# Patient Record
Sex: Male | Born: 1960
Health system: Southern US, Community
[De-identification: ages and names within clinical notes are randomized; demographics above are authoritative.]

## PROBLEM LIST (undated history)

## (undated) DIAGNOSIS — I10 Essential (primary) hypertension: Secondary | ICD-10-CM

## (undated) DIAGNOSIS — J45909 Unspecified asthma, uncomplicated: Secondary | ICD-10-CM

## (undated) DIAGNOSIS — G4730 Sleep apnea, unspecified: Secondary | ICD-10-CM

## (undated) DIAGNOSIS — M1990 Unspecified osteoarthritis, unspecified site: Secondary | ICD-10-CM

## (undated) DIAGNOSIS — R0600 Dyspnea, unspecified: Secondary | ICD-10-CM

## (undated) HISTORY — PX: BRAIN SURGERY: SHX531

## (undated) HISTORY — PX: BACK SURGERY: SHX140

## (undated) HISTORY — PX: FOOT SURGERY: SHX648

## (undated) HISTORY — PX: SHOULDER SURGERY: SHX246

## (undated) HISTORY — PX: KNEE SURGERY: SHX244

---

## 2002-10-10 ENCOUNTER — Emergency Department (HOSPITAL_COMMUNITY): Admission: EM | Admit: 2002-10-10 | Discharge: 2002-10-10 | Payer: Self-pay | Admitting: Emergency Medicine

## 2003-08-03 ENCOUNTER — Emergency Department (HOSPITAL_COMMUNITY): Admission: EM | Admit: 2003-08-03 | Discharge: 2003-08-03 | Payer: Self-pay | Admitting: Emergency Medicine

## 2003-08-04 ENCOUNTER — Emergency Department (HOSPITAL_COMMUNITY): Admission: EM | Admit: 2003-08-04 | Discharge: 2003-08-04 | Payer: Self-pay | Admitting: Emergency Medicine

## 2003-08-30 ENCOUNTER — Ambulatory Visit (HOSPITAL_COMMUNITY): Admission: RE | Admit: 2003-08-30 | Discharge: 2003-08-30 | Payer: Self-pay | Admitting: Family Medicine

## 2003-11-20 ENCOUNTER — Emergency Department (HOSPITAL_COMMUNITY): Admission: EM | Admit: 2003-11-20 | Discharge: 2003-11-20 | Payer: Self-pay | Admitting: Emergency Medicine

## 2003-11-28 ENCOUNTER — Ambulatory Visit (HOSPITAL_COMMUNITY): Admission: RE | Admit: 2003-11-28 | Discharge: 2003-11-28 | Payer: Self-pay | Admitting: General Surgery

## 2004-02-04 ENCOUNTER — Emergency Department (HOSPITAL_COMMUNITY): Admission: EM | Admit: 2004-02-04 | Discharge: 2004-02-05 | Payer: Self-pay | Admitting: *Deleted

## 2004-02-06 ENCOUNTER — Inpatient Hospital Stay (HOSPITAL_COMMUNITY): Admission: AD | Admit: 2004-02-06 | Discharge: 2004-02-09 | Payer: Self-pay | Admitting: General Surgery

## 2005-03-22 ENCOUNTER — Emergency Department (HOSPITAL_COMMUNITY): Admission: EM | Admit: 2005-03-22 | Discharge: 2005-03-22 | Payer: Self-pay | Admitting: Emergency Medicine

## 2005-05-31 ENCOUNTER — Emergency Department (HOSPITAL_COMMUNITY): Admission: EM | Admit: 2005-05-31 | Discharge: 2005-05-31 | Payer: Self-pay | Admitting: Emergency Medicine

## 2005-09-24 ENCOUNTER — Emergency Department (HOSPITAL_COMMUNITY): Admission: EM | Admit: 2005-09-24 | Discharge: 2005-09-24 | Payer: Self-pay | Admitting: *Deleted

## 2005-10-04 ENCOUNTER — Emergency Department (HOSPITAL_COMMUNITY): Admission: EM | Admit: 2005-10-04 | Discharge: 2005-10-04 | Payer: Self-pay | Admitting: Emergency Medicine

## 2005-12-06 ENCOUNTER — Emergency Department (HOSPITAL_COMMUNITY): Admission: EM | Admit: 2005-12-06 | Discharge: 2005-12-06 | Payer: Self-pay | Admitting: Emergency Medicine

## 2005-12-14 ENCOUNTER — Emergency Department (HOSPITAL_COMMUNITY): Admission: EM | Admit: 2005-12-14 | Discharge: 2005-12-14 | Payer: Self-pay | Admitting: Emergency Medicine

## 2005-12-28 ENCOUNTER — Emergency Department (HOSPITAL_COMMUNITY): Admission: EM | Admit: 2005-12-28 | Discharge: 2005-12-28 | Payer: Self-pay | Admitting: Emergency Medicine

## 2006-02-11 ENCOUNTER — Inpatient Hospital Stay (HOSPITAL_COMMUNITY): Admission: EM | Admit: 2006-02-11 | Discharge: 2006-02-16 | Payer: Self-pay | Admitting: Emergency Medicine

## 2006-02-13 ENCOUNTER — Ambulatory Visit: Payer: Self-pay | Admitting: *Deleted

## 2006-02-20 ENCOUNTER — Inpatient Hospital Stay (HOSPITAL_COMMUNITY): Admission: EM | Admit: 2006-02-20 | Discharge: 2006-02-24 | Payer: Self-pay | Admitting: Emergency Medicine

## 2006-02-27 ENCOUNTER — Inpatient Hospital Stay (HOSPITAL_COMMUNITY): Admission: EM | Admit: 2006-02-27 | Discharge: 2006-03-05 | Payer: Self-pay | Admitting: Emergency Medicine

## 2006-03-04 ENCOUNTER — Encounter (INDEPENDENT_AMBULATORY_CARE_PROVIDER_SITE_OTHER): Payer: Self-pay | Admitting: *Deleted

## 2007-08-14 ENCOUNTER — Emergency Department (HOSPITAL_COMMUNITY): Admission: EM | Admit: 2007-08-14 | Discharge: 2007-08-14 | Payer: Self-pay | Admitting: Emergency Medicine

## 2007-09-10 ENCOUNTER — Emergency Department (HOSPITAL_COMMUNITY): Admission: EM | Admit: 2007-09-10 | Discharge: 2007-09-10 | Payer: Self-pay | Admitting: Emergency Medicine

## 2007-12-28 ENCOUNTER — Ambulatory Visit: Admission: RE | Admit: 2007-12-28 | Discharge: 2007-12-28 | Payer: Self-pay | Admitting: Family Medicine

## 2008-01-08 ENCOUNTER — Emergency Department (HOSPITAL_COMMUNITY): Admission: EM | Admit: 2008-01-08 | Discharge: 2008-01-08 | Payer: Self-pay | Admitting: Emergency Medicine

## 2008-03-13 ENCOUNTER — Emergency Department (HOSPITAL_COMMUNITY): Admission: EM | Admit: 2008-03-13 | Discharge: 2008-03-13 | Payer: Self-pay | Admitting: Emergency Medicine

## 2008-03-14 ENCOUNTER — Emergency Department (HOSPITAL_COMMUNITY): Admission: EM | Admit: 2008-03-14 | Discharge: 2008-03-14 | Payer: Self-pay | Admitting: Emergency Medicine

## 2009-11-03 ENCOUNTER — Emergency Department (HOSPITAL_COMMUNITY): Admission: EM | Admit: 2009-11-03 | Discharge: 2009-11-03 | Payer: Self-pay | Admitting: Emergency Medicine

## 2009-11-23 ENCOUNTER — Ambulatory Visit (HOSPITAL_COMMUNITY): Admission: RE | Admit: 2009-11-23 | Discharge: 2009-11-23 | Payer: Self-pay | Admitting: Family Medicine

## 2009-12-26 ENCOUNTER — Emergency Department (HOSPITAL_COMMUNITY): Admission: EM | Admit: 2009-12-26 | Discharge: 2009-12-26 | Payer: Self-pay | Admitting: Emergency Medicine

## 2010-01-01 ENCOUNTER — Ambulatory Visit (HOSPITAL_COMMUNITY): Admission: RE | Admit: 2010-01-01 | Discharge: 2010-01-01 | Payer: Self-pay | Admitting: Family Medicine

## 2010-01-31 ENCOUNTER — Emergency Department (HOSPITAL_COMMUNITY): Admission: EM | Admit: 2010-01-31 | Discharge: 2010-01-31 | Payer: Self-pay | Admitting: Emergency Medicine

## 2010-02-17 ENCOUNTER — Emergency Department (HOSPITAL_COMMUNITY): Admission: EM | Admit: 2010-02-17 | Discharge: 2010-02-17 | Payer: Self-pay | Admitting: Emergency Medicine

## 2010-02-26 ENCOUNTER — Emergency Department (HOSPITAL_COMMUNITY): Admission: EM | Admit: 2010-02-26 | Discharge: 2010-02-26 | Payer: Self-pay | Admitting: Emergency Medicine

## 2010-04-16 ENCOUNTER — Emergency Department (HOSPITAL_COMMUNITY): Admission: EM | Admit: 2010-04-16 | Discharge: 2010-04-16 | Payer: Self-pay | Admitting: Emergency Medicine

## 2010-06-11 ENCOUNTER — Emergency Department (HOSPITAL_COMMUNITY): Admission: EM | Admit: 2010-06-11 | Discharge: 2010-06-11 | Payer: Self-pay | Admitting: Emergency Medicine

## 2010-06-17 ENCOUNTER — Emergency Department (HOSPITAL_COMMUNITY): Admission: EM | Admit: 2010-06-17 | Discharge: 2010-06-17 | Payer: Self-pay | Admitting: Emergency Medicine

## 2010-08-02 ENCOUNTER — Emergency Department (HOSPITAL_COMMUNITY): Admission: EM | Admit: 2010-08-02 | Discharge: 2010-08-02 | Payer: Self-pay | Admitting: Emergency Medicine

## 2010-09-05 ENCOUNTER — Emergency Department (HOSPITAL_COMMUNITY)
Admission: EM | Admit: 2010-09-05 | Discharge: 2010-09-05 | Payer: Self-pay | Source: Home / Self Care | Admitting: Emergency Medicine

## 2010-09-16 ENCOUNTER — Emergency Department (HOSPITAL_COMMUNITY)
Admission: EM | Admit: 2010-09-16 | Discharge: 2010-09-16 | Payer: Self-pay | Source: Home / Self Care | Admitting: Emergency Medicine

## 2010-09-28 ENCOUNTER — Emergency Department (HOSPITAL_COMMUNITY)
Admission: EM | Admit: 2010-09-28 | Discharge: 2010-09-28 | Payer: Self-pay | Source: Home / Self Care | Admitting: Emergency Medicine

## 2010-10-02 ENCOUNTER — Emergency Department (HOSPITAL_COMMUNITY)
Admission: EM | Admit: 2010-10-02 | Discharge: 2010-10-02 | Payer: Self-pay | Source: Home / Self Care | Admitting: Emergency Medicine

## 2010-10-10 ENCOUNTER — Emergency Department (HOSPITAL_COMMUNITY)
Admission: EM | Admit: 2010-10-10 | Discharge: 2010-10-10 | Payer: Self-pay | Source: Home / Self Care | Admitting: Emergency Medicine

## 2010-10-25 ENCOUNTER — Ambulatory Visit (HOSPITAL_COMMUNITY)
Admission: RE | Admit: 2010-10-25 | Discharge: 2010-10-25 | Payer: Self-pay | Source: Home / Self Care | Attending: Family Medicine | Admitting: Family Medicine

## 2010-11-03 ENCOUNTER — Encounter: Payer: Self-pay | Admitting: Family Medicine

## 2010-11-15 ENCOUNTER — Other Ambulatory Visit (HOSPITAL_COMMUNITY): Payer: Self-pay | Admitting: Family Medicine

## 2010-11-15 DIAGNOSIS — R52 Pain, unspecified: Secondary | ICD-10-CM

## 2010-11-16 ENCOUNTER — Emergency Department (HOSPITAL_COMMUNITY)
Admission: EM | Admit: 2010-11-16 | Discharge: 2010-11-16 | Disposition: A | Discharge status: Home / Self Care | Payer: PRIVATE HEALTH INSURANCE | Attending: Emergency Medicine | Admitting: Emergency Medicine

## 2010-11-16 ENCOUNTER — Emergency Department (HOSPITAL_COMMUNITY)
Admit: 2010-11-16 | Discharge: 2010-11-16 | Disposition: A | Discharge status: Home / Self Care | Payer: PRIVATE HEALTH INSURANCE

## 2010-11-16 DIAGNOSIS — Y9229 Other specified public building as the place of occurrence of the external cause: Secondary | ICD-10-CM | POA: Insufficient documentation

## 2010-11-16 DIAGNOSIS — M79609 Pain in unspecified limb: Secondary | ICD-10-CM | POA: Insufficient documentation

## 2010-11-16 DIAGNOSIS — S61209A Unspecified open wound of unspecified finger without damage to nail, initial encounter: Secondary | ICD-10-CM | POA: Insufficient documentation

## 2010-11-16 DIAGNOSIS — I10 Essential (primary) hypertension: Secondary | ICD-10-CM | POA: Insufficient documentation

## 2010-11-16 DIAGNOSIS — Z9889 Other specified postprocedural states: Secondary | ICD-10-CM | POA: Insufficient documentation

## 2010-11-16 DIAGNOSIS — G40909 Epilepsy, unspecified, not intractable, without status epilepticus: Secondary | ICD-10-CM | POA: Insufficient documentation

## 2010-11-16 DIAGNOSIS — W268XXA Contact with other sharp object(s), not elsewhere classified, initial encounter: Secondary | ICD-10-CM | POA: Insufficient documentation

## 2010-11-20 ENCOUNTER — Ambulatory Visit (HOSPITAL_COMMUNITY): Payer: PRIVATE HEALTH INSURANCE

## 2010-11-21 ENCOUNTER — Ambulatory Visit (HOSPITAL_COMMUNITY)
Admission: RE | Admit: 2010-11-21 | Discharge: 2010-11-21 | Disposition: A | Discharge status: Home / Self Care | Payer: PRIVATE HEALTH INSURANCE | Source: Ambulatory Visit | Attending: Family Medicine | Admitting: Family Medicine

## 2010-11-21 DIAGNOSIS — M5126 Other intervertebral disc displacement, lumbar region: Secondary | ICD-10-CM | POA: Insufficient documentation

## 2010-11-21 DIAGNOSIS — M51379 Other intervertebral disc degeneration, lumbosacral region without mention of lumbar back pain or lower extremity pain: Secondary | ICD-10-CM | POA: Insufficient documentation

## 2010-11-21 DIAGNOSIS — M545 Low back pain, unspecified: Secondary | ICD-10-CM | POA: Insufficient documentation

## 2010-11-21 DIAGNOSIS — R52 Pain, unspecified: Secondary | ICD-10-CM

## 2010-11-21 DIAGNOSIS — M6281 Muscle weakness (generalized): Secondary | ICD-10-CM | POA: Insufficient documentation

## 2010-11-21 DIAGNOSIS — M5137 Other intervertebral disc degeneration, lumbosacral region: Secondary | ICD-10-CM | POA: Insufficient documentation

## 2010-11-21 DIAGNOSIS — M79609 Pain in unspecified limb: Secondary | ICD-10-CM | POA: Insufficient documentation

## 2010-12-30 LAB — DIFFERENTIAL
Basophils Absolute: 0 10*3/uL (ref 0.0–0.1)
Basophils Relative: 0 % (ref 0–1)
Eosinophils Absolute: 0.2 10*3/uL (ref 0.0–0.7)
Eosinophils Relative: 3 % (ref 0–5)
Lymphocytes Relative: 28 % (ref 12–46)

## 2010-12-30 LAB — POCT CARDIAC MARKERS
CKMB, poc: <1 ng/mL — ABNORMAL LOW (ref 1.0–8.0)
Troponin i, poc: <0.05 ng/mL (ref 0.00–0.09)

## 2010-12-30 LAB — BASIC METABOLIC PANEL
CO2: 28 mEq/L (ref 19–32)
Calcium: 9.5 mg/dL (ref 8.4–10.5)
GFR calc non Af Amer: >60 mL/min (ref >60)
Glucose, Bld: 94 mg/dL (ref 70–99)
Potassium: 3.8 mEq/L (ref 3.5–5.1)

## 2010-12-30 LAB — CBC
Hemoglobin: 14.9 g/dL (ref 13.0–17.0)
MCV: 84.9 fL (ref 78.0–100.0)
Platelets: 295 10*3/uL (ref 150–400)
RDW: 14.8 % (ref 11.5–15.5)

## 2011-02-26 NOTE — Procedures (Signed)
NAME:  [name], [name]               ACCOUNT NO.:  [id number]   MEDICAL RECORD NO.:  [id number]          PATIENT TYPE:  OUT   LOCATION:  SLEE                          FACILITY:  [facility]   PHYSICIAN:  Kofi A. Gerilyn Pilgrim, M.D. DATE OF BIRTH:  Nov 14, 1960   DATE OF PROCEDURE:  DATE OF DISCHARGE:  12/28/2007                             SLEEP DISORDER REPORT   POLYSOMNOGRAPH REPORT.   DATE OF STUDY:  December 28, 2007.   REFERRING PHYSICIAN:  Kofi A. Gerilyn Pilgrim, M.D.   INDICATIONS FOR PROCEDURE:  The patient is a 50 year old man who has  snoring, hypersomnia and is suspected of having obstructive sleep  pattern syndrome.  BMI 39.   MEDICATIONS:  Dilantin, Zyrtec.   SLEEPING SCALE:  7.   SLEEP STAGE SUMMARY:  Total recording time of 419 minutes.  Sleep  efficiency 82 minutes.  Sleep patency 15 minute.  REM latency 100%, 90  minutes.  Stage N1 7.6% and 276.6%, N3 3.5% and REM sleep 12.4%.   RESPIRATORY SUMMARY:  AHI 48.8.  There were 278 high hypopneic events  and 2 central events.  There were 2 mixed events.  The baseline oxygen  saturation is 96% with the lowest saturation 83% REM sleep.  The REM AHI  is 36.7.  The non REM AHI is 50.   LIMB MOVEMENTS SUMMARY:  The PLM index is 0.   ELECTROCARDIOGRAM SUMMARY:  Average heart rate 61.   ASSESSMENT:  Severe obstructive sleep pattern syndrome.      Kofi A. Gerilyn Pilgrim, M.D.  Electronically Signed     KAD/MEDQ  D:  12/29/2007  T:  12/30/2007  Job:  [id number]

## 2011-03-01 NOTE — H&P (Signed)
NAMERYKAR, WITTENBORN               ACCOUNT NO.:  [id number]   MEDICAL RECORD NO.:  [id number]          PATIENT TYPE:  INP   LOCATION:  IC03                          FACILITY:  [facility]   PHYSICIAN:  Margaretmary Dys, M.D.DATE OF BIRTH:  September 21, 1961   DATE OF ADMISSION:  02/20/2006  DATE OF DISCHARGE:  LH                                HISTORY & PHYSICAL   ADMISSION DIAGNOSES:  1.  Hypoxic respiratory failure.  2.  Bilateral multifocal pneumonia, concerning for atypical pneumonia versus      PCP.  3.  Hypertension.  4.  History of polysubstance abuse.   CHIEF COMPLAINT:  Shortness of breath.   HISTORY OF PRESENT ILLNESS:  [name] is a 50 year old African-American  male who presented to the emergency room with worsening shortness of breath  and cough over 3-days duration.  The patient was actually in the hospital on  Feb 11, 2006, and was discharged on Feb 16, 2006 with symptoms of pneumonia.  He was sent home on Vantin.  The patient said he felt slightly better, but  went back to work and ever since he has been back to work he has not felt  the same.  He said he has been feeling hot and cold, but he has not checked  his temperature.  He denies any nausea or vomiting or abdominal pain.  He  also reports pleuritic chest pain, mostly on the flank especially when  coughing.  He denies any hemoptysis.  He feels like he has no weight loss.   REVIEW OF SYSTEMS:  The 10-point review of systems otherwise negative,  except as mentioned in the history of present illness.   PAST MEDICAL HISTORY:  1.  Bilateral pneumonia, recent admission Feb 11, 2006.  2.  Hypertension.  3.  Polysubstance abuse.  4.  Questionable history of seizure disorder.  5.  History of closed-head trauma.  6.  History of hemorrhoids.   DISCHARGE MEDICATIONS:  (Discharge medications on Feb 16, 2006 were)  1.  Vantin 200 mg p.o. b.i.d. for 5 days.  2.  Combivent q.4 h. 2 puffs.  3.  Aspirin 81 mg p.o. once a day.  4.   Benicar/hydrochlorothiazide 20/25 mg one p.o. daily.  5.  Prednisone 10 mg p.o. daily for 2 days, then stop.  6.  Vicodin 5/500 mg as needed q.4 h. for pain.   ALLERGIES:  He reports NO KNOWN DRUG ALLERGIES.   FAMILY HISTORY:  Positive for hypertension; no coronary artery disease.   SOCIAL HISTORY:  The patient is single.  He smokes up to about a pack of  cigarettes a day.  He has a 30-pack/yr of smoking.  He quit drinking alcohol  several months ago.  He admits to using cocaine occasionally.  He is not  married and has a child.  He works part-time as a Music therapist.   PHYSICAL EXAMINATION:  GENERAL:  The patient is mild to moderate respiratory  distress, especially on completing sentences.  VITAL SIGNS:  Oxygen saturation was noted to be 80% on 4 liters on arrival  in the emergency room, and  is likely improved.  Blood pressure 150/73, pulse  93, respirations 24, temperature 98.4.  HEENT:  Normocephalic and atraumatic.  Oral mucosa was dry.  No exudates  were noted.  NECK:  Supple, no JVD and no lymphadenopathy.  LUNGS:  Diffuse rhonchi with occasional crackles at the bases.  No wheezing  was heard.  HEART:  S1, S2 regular.  No S3, gallops or rubs.  ABDOMEN:  Soft, nontender.  Bowel sounds positive.  No masses palpable.  EXTREMITIES:  No pitting pedal edema.  No calf induration or tenderness was  noted.  CNS:  Grossly intact with no focal deficits.   LABORATORY DATA:  ABG on 4 liters oxygen showed a pH 7.44, p2O2 39, pO2 58,  bicarbonate 26.1.  Oxygen saturation 91.6.  White blood cell count 29.2,  hemoglobin 14.6, hematocrit 43.8, platelet count 355, neutrophils 93%.  PT  16.4, INR 1.2, d-Dimer negative.  Sodium 136, potassium 3.7, chloride 104,  CO2 24, glucose 103, BUN 15, creatinine 110.  LDH 223, calcium level 8.6, B-  natriuretic peptide 82.6.  Blood cultures from previous admissions were negative for any organisms.   CT scan shows diffuse bilateral alveolar infiltrates,  compatible more with  an infectious process and obviously worse when compared to 10 days ago.   ASSESSMENT AND PLAN:  [name] is a 50 year old African-American male who  presents to the emergency room after a recent hospitalization with symptoms  consistent with multilobar pneumonia; raising concern for a possible  atypical pneumonia.  Concerned about possible Legionella versus PCP.  The  plan is to admit him to intensive care unit at this time.  Initially the  plan was to admit the patient to 2-A, but he is fairly hypoxic.  The patient  will be monitored closely in the intensive care unit.  A respiratory  therapist will evaluate him.   I will start on Cefotan 1 gram IV q.12 h., and also on Levaquin 750 mg IV to  broadly cover for atypical organisms.  We should be sending urine for  Legionella antigen.  I have requested a pulmonary consult with Dr. Juanetta Gosling.  The patient may need a bronchoscopy if he does not improve.  HIV test is  pending.  Marland Kitchen   CODE STATUS:  FULL CODE.   MEDICATIONS:  DVT prophylaxis will be with Lovenox.  GI prophylaxis with  Protonix.      Margaretmary Dys, M.D.  Electronically Signed    AM/MEDQ  D:  02/20/2006  T:  02/20/2006  Job:  [id number]

## 2011-03-01 NOTE — Group Therapy Note (Signed)
[name], [name]               ACCOUNT NO.:  [id number]   MEDICAL RECORD NO.:  [id number]          PATIENT TYPE:  INP   LOCATION:  A212                          FACILITY:  [facility]   PHYSICIAN:  Osvaldo Shipper, MD     DATE OF BIRTH:  1961/09/11   DATE OF PROCEDURE:  03/03/2006  DATE OF DISCHARGE:                                   PROGRESS NOTE   SUBJECTIVE:  Patient feels better.  He is breathing easier.  No real  complaints offered today.   OBJECTIVE:  VITAL SIGNS:  Continues to be afebrile with heart rate in the  50s-70s.  Respiratory rate is about 16.  Blood pressure one time reading  this morning 152/103.  blood pressures have been running in the 120s-150s  systolic and 80s-90s diastolic.  Saturation 95% on room air.  LUNGS:  Clear to auscultation bilaterally.  CARDIOVASCULAR:  S1, S2 is normal, regular.  No murmurs appreciated.   LABORATORY DATA:  CBC is completely unremarkable today.  His BMP shows a  potassium of 3.4, otherwise unremarkable.   ASSESSMENT/PLAN:  1.  Recurrent pneumonias.  This is patient's third admission in three weeks      for pneumonia.  Patient is emphatic that he did get his prescriptions      for Levaquin filled the last time he was discharged and had been taking      them at home; however, he felt sick again.  Since this was his third      admission patient was put on broad-spectrum antibiotic coverage with the      vancomycin, Levaquin, and cefepime.  Cefepime was started because he was      in the hospital recently.  There was a concern about this being      eosinophilic pneumonia.  Peripheral eosinophils have been 0.1-0.2.      However, apparently in acute phases peripheral eosinophil count may not      be high.  Dr. Juanetta Gosling was consulted to evaluate this patient.  Plan is      to do a bronchoscopy tomorrow to see if he can determine the etiology      for his recurrent pneumonias.  In the meantime, we will continue current      treatment for  now.   1.  Hypertension.  He had a one time high blood pressure reading this      morning.  I will not change his medications for now.  If blood pressure      continues to remain high we will adjust his medication or add new      medications.   1.  Polysubstance abuse.  Patient has been counseled in the past to refrain      from tobacco/cocaine use.  He verbalized understanding.   Otherwise, he seems to be tolerating his p.o. intake.  His potassium will be  replaced today.  DVT/GI prophylaxis will be continued.   Anticipate patient being able to go home in the next two days or so.      Osvaldo Shipper, MD  Electronically Signed  GK/MEDQ  D:  03/03/2006  T:  03/03/2006  Job:  [id number]

## 2011-03-01 NOTE — Op Note (Signed)
NAME:  [name], [name] NO.:  [id number]   MEDICAL RECORD NO.:  [id number]                    PATIENT TYPE:   LOCATION:                                       FACILITY:   PHYSICIAN:  Dirk Dress. Katrinka Blazing, M.D.                DATE OF BIRTH:   DATE OF PROCEDURE:  DATE OF DISCHARGE:                                 OPERATIVE REPORT   PREOPERATIVE DIAGNOSIS:  Prolapsed, thrombosed, internal and external  hemorrhoids.   POSTOPERATIVE DIAGNOSIS:  Prolapsed, thrombosed, internal and external  hemorrhoids.   PROCEDURE:  Internal and external hemorrhoidectomy.   SURGEON:  Dirk Dress. Katrinka Blazing, M.D.   DESCRIPTION:  Under spinal anesthesia the patient was prepped and draped in  the prone position.  A digital dilatation of the anal sphincter was carried  out.  Operating anoscope were inserted.  There were 2 large and 1 small  group of hemorrhoids. These included internal and external components with  thrombosis of most of the external component on either side.  The right side  was done initially. It was high ligated at the base with 2-0 chromic.  The  mucosa including the internal and external group of hemorrhoidal cushions  was incised using electrocautery.  Care was taken to preserve the internal  and external sphincter.   The hemorrhoidal tissue was excised.  The mucosa was then reapproximated  using running locking 2-0 chromic with 2 passes through on this side.  There  was minimal bleeding.  A similar procedure was done on the left side.  The  wound was closed with 2 rows of 2-0 chromic suture.  The area was  infiltrated with 0.5% Marcaine with epinephrine 1:200,000 concentration in  each operative site.  Sponges were verified as correct.  The patient  tolerated the procedure well.  A perineal pad was placed.  He was  transferred to a bed, and taken to the postanesthetic care unit for  monitoring.      ___________________________________________                           Dirk Dress. Katrinka Blazing, M.D.   LCS/MEDQ  D:  02/07/2004  T:  02/07/2004  Job:  [id number]

## 2011-03-01 NOTE — Discharge Summary (Signed)
NAME:  [name], [name]                         ACCOUNT NO.:  [id number]   MEDICAL RECORD NO.:  [id number]                   PATIENT TYPE:  INP   LOCATION:  A321                                 FACILITY:  [facility]   PHYSICIAN:  Dirk Dress. Katrinka Blazing, M.D.                DATE OF BIRTH:  05-Feb-1961   DATE OF ADMISSION:  02/06/2004  DATE OF DISCHARGE:  02/09/2004                                 DISCHARGE SUMMARY   DISCHARGE DIAGNOSES:  1. Prolapsed thrombosed hemorrhoids.  2. Seizure disorder.  3. Hypertension.  4. History of closed head trauma with memory loss.   SPECIAL PROCEDURE:  Hemorrhoidectomy February 07, 2004.   DISPOSITION:  The patient is discharged home in stable satisfactory  condition/   DISCHARGE MEDICATIONS:  1. Lortab 10/650 1or 2 every 4 hours as needed for pain.  2. Cardizem LA 360 mg daily.  3. Neurontin 300 mg 2 b.i.d.  4. Dilantin 100 mg 4 q.h.s.  5. Phenobarbital 100 mg q.h.s.  6. Benicar HCT 20/25 daily.  7. Fioricet 2 q.4 hours p.r.n.  8. Phenergan 25 mg q.4 hours p.r.n.  9. Flagyl 500 mg t.i.d.   Followup appointment was made for the patient to be seen on Feb 23, 2004.   SUMMARY:  50 year-old male with a history of hemorrhoidal pain for  five days.  The pain became increasingly more severe.  He was seen in the  office and was noted to have prolapsed, thrombosed hemorrhoids.   PAST HISTORY:  Positive for:  1. Hypertension.  2. Seizure disorder.  3. Posttraumatic headaches.  4. Postconcussive memory loss.   EXAMINATION OF HIS ANORECTAL AREA:  Revealed enlarged, swollen, prolapsed  internal and external hemorrhoids.   HOSPITAL COURSE:  The patient was admitted, started on IV medications, given  antibiotics because of swelling of the thrombosed tissue.  He was taken to  the operating room on February 07, 2004, where hemorrhoidectomy was  done with removal of internal and external hemorrhoidal __________.  He had  an uneventful postoperative course.  He  remained afebrile.  There was no  change in the white count.  He was discharged home on the second  postoperative day in satisfactory condition.     ___________________________________________                                         Dirk Dress Katrinka Blazing, M.D.   LCS/MEDQ  D:  03/11/2004  T:  03/11/2004  Job:  [id number]

## 2011-03-01 NOTE — Consult Note (Signed)
[name], [name]               ACCOUNT NO.:  [id number]   MEDICAL RECORD NO.:  [id number]          PATIENT TYPE:  INP   LOCATION:  A201                          FACILITY:  [facility]   PHYSICIAN:  Edward L. Juanetta Gosling, M.D.DATE OF BIRTH:  Mar 13, 1961   DATE OF CONSULTATION:  02/20/2006  DATE OF DISCHARGE:                                   CONSULTATION   REASON FOR CONSULTATION:  Pneumonia.   HISTORY OF PRESENT ILLNESS:  [name] is a 50 year old who was  hospitalized earlier this month with what appeared to be pneumonia.  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  He eventually came back to the emergency room.  When he  was seen in the ER the second time, he had what looked like bilateral  infiltrates.  He was fairly markedly short of breath, and his x-ray had  actually gotten worse since his discharge.  He has a previous history of  pneumonia as well as several other medical problems.  Since she has been  admitted he had been coughing, congested, short of breath, and hypoxic.   His past medical history, otherwise, is positive for hypertension, seizure  disorder, previous history of closed head trauma, hemorrhoids, and pneumonia  as mentioned.  Surgically, he has had an hemorrhoidectomy, surgery on his  left shoulder and right knee, some sort of brain surgery from a closed head  trauma, I am not sure exactly what he had.   FAMILY HISTORY:  Positive for hypertension.   SOCIAL HISTORY:  He is disabled.  He does smoke about a pack of cigarettes  daily.  He has a history of use of alcohol and cocaine.   ALLERGIES:  He has no known drug allergies.   REVIEW OF SYSTEMS:  His review of systems, except as mentioned, is pretty  much negative now. He says he has not had a fever, he has  had some chills  and has had some cough which was nonproductive.   PHYSICAL EXAMINATION:  GENERAL:  Well-developed, well-nourished African-American male who is in no  acute distress.  VITAL SIGNS:  He is afebrile now.  HEENT:  His pupils equal, round, reactive to light and accommodation, nose  and throat are clear.  NECK:  Supple without masses.  CHEST:  His chest shows some rales and rhonchi.  He drops his O2 sat when he  talks.  HEART:  Regular.  ABDOMEN:  Soft without masses.  EXTREMITIES:  No edema.  CNS:  Exam grossly intact.   Chest x-ray shows an infiltrate on the right.  I do not have any other lab  work.  He has a pO2 in the 60s on 2 liters of O2..   I discussed the situation with Dr. Smith Nguyen who feels that he may have an  atypical pneumonia and, of course, he might.  At this  point, I would plan to  cover for that, cover for Staph with vancomycin.  I agree with transferring  to the ICU and would continue with his other treatments in the meantime.  Thanks for allowing me to see him with you, I will plan to follow.      Edward L. Juanetta Gosling, M.D.  Electronically Signed     ELH/MEDQ  D:  02/20/2006  T:  02/21/2006  Job:  [id number]

## 2011-03-01 NOTE — Discharge Summary (Signed)
[name], [name]               ACCOUNT NO.:  [id number]   MEDICAL RECORD NO.:  [id number]          PATIENT TYPE:  INP   LOCATION:  A212                          FACILITY:  [facility]   PHYSICIAN:  Hanley Hays. Dechurch, M.D.DATE OF BIRTH:  11/30/1960   DATE OF ADMISSION:  02/27/2006  DATE OF DISCHARGE:  05/23/2007LH                                 DISCHARGE SUMMARY   DIAGNOSES:  1.  Bilateral multifocal, multilobar pneumonia.  2.  Hypoxemic respiratory failure.  3.  Probable reactive airways.  4.  Hypertension.  5.  History of polysubstance abuse.  6.  History of closed head injury with questionable seizure activity, not      observed during this hospital stay.   MEDICATIONS:  1.  The patient will continue Levaquin 750 daily to complete an additional      10-day course.  2.  Augmentin 875 b.i.d.  3.  Combivent two puffs q.4-6h. p.r.n. wheezing.  4.  Lortab 5/500 one p.o. q.6-8h. p.r.n. chest pain, #20 given, no refills.  5.  Avapro 150 mg daily.   CONDITION:  Improved.   HOSPITAL COURSE:  The patient is a 50 year old gentleman who was initially  seen Feb 11, 2006, with a 48-hour history of nonproductive cough and  shortness of breath worse on exertion, and chest pain.  His chest x-ray  showed a diffuse right-sided infiltrate.  He was admitted to the hospital at  that time and cardiac ischemia was ruled out.  He improved to the point that  he was able to be discharged on Feb 16, 2006, only to return back to the  emergency room on May 10 with worsening shortness of breath since his  discharge.  He had complained of chills and pleuritic chest pain and  wheezing.  His chest x-ray at that time revealed worsening bilateral  infiltrates.  He was treated with Vantin on discharge.  The patient was  hospitalized from May 10 to May 14 and again was improved and actually  maintained in the hospital an additional amount of time given his previous  history, and was raring to go on May 14, only  to return to the hospital on  May 17 with a similar presentation.  His chest x-ray actually showed some  patchy airspace opacities as well as lung bases which had actually improved  significantly prior to his discharge on May 14.  He had also had a CT of his  chest previously.  In any event, he was treated with broad-spectrum  antibiotics and seen in consultation once again by Dr. Juanetta Gosling of pulmonary  medicine.  He was treated with Levaquin, vancomycin, and cefepime.  He  underwent bronchoscopy which revealed some extensive changes of chronic  bronchitis with bronchial pitting and marked erythema of the bronchial walls  but no endobronchial lesions were seen.  There was not a significant amount  of exudate.  Washings were obtained.  He has some PMMs noted but no  organisms were seen on gram stain.  Cultures to date are negative.  The  patient was ambulating without difficulty.  Denied any shortness  of breath;  in fact, left the floor for approximately 2 hours on the day of discharge  and then felt he was good enough to go home.  He hemodynamically remained  stable.  The only change in his medical regimen at this time was to  discontinue his Benicar with thought the ACE inhibitor may be aggravating  his cough.  At the time of discharge he is awake, alert, ambulating without  difficulty.  His lungs are essentially clear bilaterally without any rales  or rhonchi or wheezing.  They are diminished somewhat but good air movement  is noted.  He has no edema.  The heart is regular.  His blood pressures in  the hospital have been well controlled with systolic in the 120s to 130s.  His O2 saturation is 99% on room air at rest and after exercise.  He appears  to be quite comfortable.  He is discharged to home with followup with Dr.  Juanetta Gosling in 2-3 weeks.  A long discussion was held with the patient regarding  the expectations.  Smoking cessation was discussed with him at length, as  well as the need  to refrain from cocaine abuse.  In any event, he is  discharged in stable condition.   This total encounter took about 45 minutes including initial evaluation.      Hanley Hays Josefine Class, M.D.  Electronically Signed     FED/MEDQ  D:  03/05/2006  T:  03/06/2006  Job:  [id number]

## 2011-03-01 NOTE — Group Therapy Note (Signed)
[name], [name]               ACCOUNT NO.:  [id number]   MEDICAL RECORD NO.:  [id number]          PATIENT TYPE:  INP   LOCATION:  A323                          FACILITY:  [facility]   PHYSICIAN:  Edward L. Juanetta Gosling, M.D.DATE OF BIRTH:  January 19, 1961   DATE OF PROCEDURE:  02/23/2006  DATE OF DISCHARGE:                                   PROGRESS NOTE   Mr. [name] seems to be slowly improving.  He has no new complaints.  His  temperature 97.8, pulse 57, respirations in the 20s, blood pressure 160/109,  O2 saturations 94%.  His chest is a little clearer.   ASSESSMENT:  He is doing a little better.   PLAN:  I am going to go ahead and get an x-ray today.  Otherwise, continue  with antibiotics.      Edward L. Juanetta Gosling, M.D.  Electronically Signed     ELH/MEDQ  D:  02/23/2006  T:  02/24/2006  Job:  [id number]

## 2011-03-01 NOTE — Consult Note (Signed)
[name], [name]               ACCOUNT NO.:  [id number]   MEDICAL RECORD NO.:  [id number]          PATIENT TYPE:  INP   LOCATION:  A212                          FACILITY:  [facility]   PHYSICIAN:  Edward L. Juanetta Gosling, M.D.DATE OF BIRTH:  10-18-1960   DATE OF CONSULTATION:  03/03/2006  DATE OF DISCHARGE:                                   CONSULTATION   REASON FOR CONSULTATION:  Abnormal chest x-ray.   HISTORY:  [name] is a 50 year old who has been hospitalized twice  earlier this spring with pneumonia.  He was admitted last his on the 10th of  this month, was on Levaquin, seemed to improve, was discharged home and then  stayed for about 24 hours and then became sick again.  He emphatically says  that he did get his Levaquin and he is angry that I even asked.  He says  that he took his medication and he simply got sick again.  On his last  admission he was worked up with HIV screen which was negative.  He had been  admitted on the 1st, discharged on the 6th, admitted on the 10th, discharged  on the 14th, and then readmitted on the 17th.  He says that he started  walking around, developed shortness of breath, cough, fever, chills.  He  denies any productive cough and he has not had any pleuritic chest pain.  Past medical history is positive for the pneumonias, hypertension, history  of polysubstance abuse, some question of a seizure disorder, closed head  trauma.   MEDICATIONS ON ADMISSION:  1.  Levaquin 750 mg daily.  2.  Combivent two puffs q.4h. p.r.n.  3.  Aspirin 81 mg daily.  4.  Benicar/HCTZ 20/25 daily.   He has no known drug allergies.   FAMILY HISTORY:  Positive for hypertension.  No known history of any sort of  lung disease.   SOCIAL HISTORY:  He has about a 30-pack-year smoking history.  Stopped  drinking any alcohol several months ago.  He still uses marijuana and  cocaine occasionally.   REVIEW OF SYSTEMS:  Otherwise negative.   PHYSICAL EXAMINATION:  GENERAL:   He is awake and alert, sitting up, looks  comfortable.  VITAL SIGNS:  His temperature is 97.4, pulse 58, respirations 20, blood  pressure 152/103, O2 saturation 96% listed on room air.  HEENT:  Pupils are equal, round, react to light and accommodation.  His nose  and throat are clear.  NECK:  Supple without masses.  CHEST:  Fairly clear, some bilateral rhonchi.  HEART:  Regular.  ABDOMEN:  Soft.   His white blood count is 5900, hemoglobin 13.6, platelets 340.  Electrolytes  shows potassium is 3.4, BUN 16, creatinine of 1.  His chest x-ray shows  bilateral infiltrates.   ASSESSMENT:  He has recurrent bilateral pneumonia.  It is really not quite  clear what has happened as far as why this keeps recurring.   PLAN:  Go ahead and treat him with intravenous antibiotics.  I think he  should probably go ahead with plans to perform bronchoscopy.  I  am try to  work to the schedule out with the operating room.      Edward L. Juanetta Gosling, M.D.  Electronically Signed     ELH/MEDQ  D:  03/03/2006  T:  03/03/2006  Job:  [id number]
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
## 2011-03-01 NOTE — Group Therapy Note (Signed)
NAMEDAVIEL, ALLEGRETTO               ACCOUNT NO.:  [id number]   MEDICAL RECORD NO.:  [id number]          PATIENT TYPE:  INP   LOCATION:  A201                          FACILITY:  [facility]   PHYSICIAN:  Edward L. Juanetta Gosling, M.D.DATE OF BIRTH:  09-21-1961   DATE OF PROCEDURE:  02/21/2006  DATE OF DISCHARGE:                                   PROGRESS NOTE   SUBJECTIVE:  Mr. Hagins was transferred to the intensive care unit because  he looked quite congested and very sick yesterday.  He is better today.  He  looks less congested.  He is more awake and alert.  His white count 13,000,  hemoglobin 13.4, platelets 329.  BMET shows electrolytes were normal, HIV  was nonreactive, drug screen positive for cocaine and opiates.   OBJECTIVE:  His chest is clearer.  He looks more comfortable.  His  temperature is 99.4 oral, O2 saturations running in the 98-100% range.   ASSESSMENT:  He looks much better.   PLAN:  I would plan to go ahead with current treatments.  I think he has  pneumonia, bilateral.  Plan would be to continue his antibiotics.      Edward L. Juanetta Gosling, M.D.  Electronically Signed     ELH/MEDQ  D:  02/21/2006  T:  02/21/2006  Job:  [id number]
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
## 2011-03-01 NOTE — Procedures (Signed)
NAMELEONCE, BALE NO.:  [id number]   MEDICAL RECORD NO.:  [id number]          PATIENT TYPE:  INP   LOCATION:  A224                          FACILITY:  [facility]   PHYSICIAN:  Vida Roller, M.D.   DATE OF BIRTH:  Nov 22, 1960   DATE OF PROCEDURE:  02/13/2006  DATE OF DISCHARGE:                                  ECHOCARDIOGRAM   PRIMARY CARE PHYSICIAN:  Hanley Hays. Josefine Class, M.D.   REFERRING PHYSICIAN:  Renato Battles, M.D.   TAPE NUMBER:  [id number].   TAPE COUNT:  [id number].   This is a 50 year old man with pneumonia and hypertension for LV systolic  function. Technical quality of study is adequate.  M-mode tracing of the  aorta is 33 mm.   Left atrium is 52 mm.   Septum is 15 mm.   Posterior wall 14 mm.   Left ventricular diastolic dimension 47 mm.   Left ventricular systolic dimension 30 mm.   2-D and Doppler imaging:  Left ventricle is normal size.  There is vigorous  LV systolic function, estimated ejection fraction 70 to 75%.  There were no  wall motion abnormalities seen.  There was moderate concentric left  ventricular hypertrophy.   The right ventricle is normal size with normal systolic function.   The left atrium is enlarged.  Right atrium appears to be normal size.   The aortic valve is mildly sclerotic.  There is no stenosis.  There is  increased velocity across the LV outflow tract which is probably consistent  with the patient's vigorous LV systolic function because the valve appears  to open very well on 2-D imaging.  There is no aortic insufficiency.   The mitral valve has no stenosis or regurgitation.   The right ventricular has trivial regurgitation.   There is no pericardial effusion.      Vida Roller, M.D.  Electronically Signed     JH/MEDQ  D:  02/13/2006  T:  02/14/2006  Job:  [id number]
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
## 2011-03-01 NOTE — Group Therapy Note (Signed)
[name], [name]               ACCOUNT NO.:  [id number]   MEDICAL RECORD NO.:  [id number]          PATIENT TYPE:  INP   LOCATION:  A212                          FACILITY:  [facility]   PHYSICIAN:  Edward L. Juanetta Gosling, M.D.DATE OF BIRTH:  May 11, 1961   DATE OF PROCEDURE:  03/05/2006  DATE OF DISCHARGE:                                   PROGRESS NOTE   SUBJECTIVE:  Patient had his bronchoscopy yesterday.  No endobronchial  lesions were seen.  He says he feels better.  He has no new complaints.  Thus far his cultures do not show any growth; and actually do not show any  organisms.   OBJECTIVE:  His exam this morning shows that he is awake and alert; looks  comfortable.  Temperature 97, pulse 64, respirations 18, blood pressure  130/85, O2 saturation 99%.  His chest is clearer.  His heart is regular.   ASSESSMENT:  He looks better.   PLAN:  To continue with his current treatments medications.  It is still not  totally clear why he has had recurrences of his pneumonia.      Edward L. Juanetta Gosling, M.D.  Electronically Signed     ELH/MEDQ  D:  03/05/2006  T:  03/05/2006  Job:  [id number]

## 2011-03-01 NOTE — Group Therapy Note (Signed)
NAMETALLIN, HART               ACCOUNT NO.:  [id number]   MEDICAL RECORD NO.:  [id number]          PATIENT TYPE:  INP   LOCATION:  A323                          FACILITY:  [facility]   PHYSICIAN:  Edward L. Juanetta Gosling, M.D.DATE OF BIRTH:  1961-05-31   DATE OF PROCEDURE:  02/22/2006  DATE OF DISCHARGE:                                   PROGRESS NOTE   SUBJECTIVE:  [name] says he feels a little better.  He is sleepy this  morning.  He has no new complaints.  He is not coughing as much, but he is  coughing.   PHYSICAL EXAMINATION:  GENERAL:  He is sleepy but arousable.  VITAL SIGNS:  His temperature is 97.5, pulse 64, respirations 16, blood  pressure 142/83, O2 saturation 94% listed on room air, but he is wearing  oxygen.  CHEST:  Still shows some rales bilaterally but clearer than it was earlier.  HEART:  Regular.   LABORATORY DATA:  Blood gas on room air listed again, although the oxygen  level was listed at 4 in one place and 21% in another.  pH 7.39, pCO2 of 43,  pO2 of 73.  His white count was 7400, hemoglobin 12.6, platelets 318.  His  electrolytes were normal.   ASSESSMENT:  He has bilateral pneumonia.  That seems to be improving slowly.  My plan would be to continue with current medications and follow.  He is on  multiple antibiotics.  He is going to need to stay on those.  One concern  also on his lab work is that despite his young age and otherwise appearing  to be in pretty good health, his albumin level is 2.5.  I am going to go  ahead and order a chest x-ray for tomorrow, continuing his other treatments  and follow.      Edward L. Juanetta Gosling, M.D.  Electronically Signed     ELH/MEDQ  D:  02/22/2006  T:  02/23/2006  Job:  [id number]

## 2011-03-01 NOTE — Group Therapy Note (Signed)
NAMEKASTIN, CERDA               ACCOUNT NO.:  [id number]   MEDICAL RECORD NO.:  [id number]          PATIENT TYPE:  INP   LOCATION:  A323                          FACILITY:  [facility]   PHYSICIAN:  Edward L. Juanetta Gosling, M.D.DATE OF BIRTH:  October 11, 1961   DATE OF PROCEDURE:  02/24/2006  DATE OF DISCHARGE:                                   PROGRESS NOTE   Mr. Panico says he is feeling better.  He has no new complaints.   PHYSICAL EXAMINATION:  VITAL SIGNS:  His physical examination today shows  his temp is 98.3, pulse 60, respirations 20 and blood pressure 110/80.  O2  sat is 97% on room air.  GENERAL:  He looks much more comfortable.   Chest x-ray is much improved.   ASSESSMENT:  He does seem to be doing better.   PLAN:  I am going to sign off.  Thank you very much for allowing me to see  him with you.      Edward L. Juanetta Gosling, M.D.  Electronically Signed     ELH/MEDQ  D:  02/24/2006  T:  02/24/2006  Job:  [id number]

## 2011-03-01 NOTE — Discharge Summary (Signed)
NAMEMARVIN, [name]               ACCOUNT NO.:  [id number]   MEDICAL RECORD NO.:  [id number]          PATIENT TYPE:  INP   LOCATION:  A224                          FACILITY:  [facility]   PHYSICIAN:  Osvaldo Shipper, MD     DATE OF BIRTH:  August 13, 1961   DATE OF ADMISSION:  02/11/2006  DATE OF DISCHARGE:  05/06/2007LH                                 DISCHARGE SUMMARY   DISCHARGE DIAGNOSES:  1.  Bilateral pneumonia, likely community acquired, improved.  2.  Hypertension, stable.  3.  Pleuritic chest pain, stable.  4.  Substance abuse, counseled.   Please review H&P dictated at the time of admission for details regarding  the patient's presenting illness.   BRIEF HOSPITAL COURSE:  Briefly, this is a 50 year old African-American male  who presented to the ED with shortness of breath and cough.  He was also  hypoxic on presentation.  His D-dimer was elevated.  Hence, he underwent a  CAT scan of his chest which showed no pulmonary emboli.  However, it showed  extensive patchy air space opacity in both lungs, compatible with pneumonia.  He also had a chest x-ray which showed diffuse right lung air space disease,  possibly pneumonia.  Based on these findings, the patient was started on  antibiotics.  He required oxygen initially, and this has been titrated down.  Currently he is saturating 98% on room air.  The patient's symptoms have  also improved.  He is no longer short of breath.  However, he does have a  pleuritic chest pain which I have told him will resolve once his pneumonia  is completely cured.  Because he is young and has a history of hypertension,  he underwent an echocardiogram as well, which showed left ventricular EF of  77% with no wall motion abnormalities.  Moderate LVH was noted.  Otherwise  there was no other significant abnormality appreciated.  He also had cardiac  enzymes which were all negative.  The patient was also started on steroids  and a nebulizer treatment for  presumed COPD, which were all titrated down  rather quickly, as it appeared that the patient did not appear to have an  acute COPD exacerbation.   Substance abuse issues involved positive cocaine in his urine.  The patient  was extensively counseled not to do cocaine, as it may cause his heart to  race faster and can even cause heart damage.   His hypertension remained stable during this admission.   On the day of discharge, the patient's vital signs were all stable.  He was  saturating well on room air.  His blood pressure was under control.  Heart  rate was under control.  He was complaining of some pleuritic chest pain.  A  STAT EKG was done, which showed no changes from previous EKGs.  He has been  ruled out for acute coronary syndrome based on cardiac markers done earlier.  His blood work from yesterday was also stable.  Based on the above, he was  considered okay for discharge.   DISCHARGE MEDICATIONS:  1.  Vantin 200  mg b.i.d. for 5 days.  2.  Combivent every 4 hours, 2 puffs.  3.  Aspirin 81 mg daily.  4.  Benicar HCT 20/25, 1 p.o. daily.  5.  Prednisone 10 mg 1 daily for 2 days, then stop.  6.  Vicodin 5/500 as needed every 6 hours for pain, a total of 10 tablets      prescribed.   Follow up with Dr. Loleta Chance early this week, before Wednesday.   PHYSICAL ACTIVITY:  As before.   DIET:  Low-salt diet.      Osvaldo Shipper, MD  Electronically Signed     GK/MEDQ  D:  02/16/2006  T:  02/16/2006  Job:  [id number]   cc:   Annia Friendly. Loleta Chance, MD  Fax: [phone]

## 2011-03-01 NOTE — Op Note (Signed)
NAMEDETAVIOUS, RINN               ACCOUNT NO.:  [id number]   MEDICAL RECORD NO.:  [id number]          PATIENT TYPE:  INP   LOCATION:  A212                          FACILITY:  [facility]   PHYSICIAN:  Edward L. Juanetta Gosling, M.D.DATE OF BIRTH:  Feb 14, 1961   DATE OF PROCEDURE:  03/04/2006  DATE OF DISCHARGE:                                 OPERATIVE REPORT   INDICATIONS FOR PROCEDURE:  Mr. Mella has had recurrent pneumonia and he is  undergoing bronchoscopy to get specimens and also to make sure he does not  have any endobronchial lesions.  There no contraindications to planned  procedure.   PREOPERATIVE DIAGNOSIS:  Abnormal chest x-ray and recurrent pneumonia.   POSTOPERATIVE DIAGNOSIS:  Abnormal chest x-ray and recurrent pneumonia.   SURGEON:  Edward L. Juanetta Gosling, M.D.   After satisfactory local anesthesia and 10 mg Versed intravenously, the  bronchoscope was introduced to the right naris.  Mr. Thornsberry seemed to have  some nasal polyps but otherwise the upper airway was normal.  His upper  airway was inspected, found to be within normal limits except for the nasal  polyps.  The vocal cords were identified and anesthetized with Xylocaine and  bronchoscope was introduced into the trachea.  There were extensive changes  of chronic bronchitis with bronchial pitting and marked erythema of the  bronchial walls, but no endobronchial lesions were seen.  The bronchoscope  was placed into the right lower lobe bronchus and bronchial lavage was done.  The bronchoscope was then placed into the left lower lobe bronchus and  bronchial lavage was done.  The procedure was then terminated.  The patient  tolerated the procedure well and was taken to the recovery room in good  condition.      Edward L. Juanetta Gosling, M.D.  Electronically Signed     ELH/MEDQ  D:  03/04/2006  T:  03/04/2006  Job:  [id number]

## 2011-03-01 NOTE — Group Therapy Note (Signed)
NAMEMARIAH, HARN               ACCOUNT NO.:  [id number]   MEDICAL RECORD NO.:  [id number]          PATIENT TYPE:  INP   LOCATION:  A201                          FACILITY:  [facility]   PHYSICIAN:  Margaretmary Dys, M.D.DATE OF BIRTH:  [birth year]   DATE OF PROCEDURE:  02/22/2006  DATE OF DISCHARGE:                                   PROGRESS NOTE   SUBJECTIVE:  The patient is much better, currently off oxygen.  He is less  tachypneic.  He continues to have pleuritic chest pain with a dry cough.  He  has had no fever or chills, no nausea or vomiting.  Appetite is excellent.   OBJECTIVE:  Conscious, alert, comfortable, not in acute distress.  Vital  signs: Blood pressure 152/74, pulse 66, respirations 20, temperature 98.9,  oxygen saturation 96% on room air. HEENT exam: Normocephalic and atraumatic.  Oral mucosa was moist with no exudate.  Neck is supple with no JVD, no  lymphadenopathy. Lungs are clear clinically with good air entry bilaterally.  Heart: S1, S2 regular.  No S3, gallops, or rubs. Abdomen was soft,  nontender.  Bowel sounds positive.  Extremities: No pitting pedal edema.   Laboratory diagnostic data:  White blood cell count was down 7.4, hemoglobin  12.6, hematocrit 37.7, platelet count 318 with no left shift.  Sodium 138,  potassium 3.5, chloride 107, CO2 28, glucose 103, BUN 8, creatinine 1.1, AST  14, ALT 30.   ASSESSMENT AND PLAN:  Mr. Manthei is a 50 year old African-American male  admitted in hypoxic respiratory failure with bilateral multifocal pneumonia.  The patient is a lot better today.   He is currently off oxygen with excellent saturation.  Possibility of human  immunodeficiency virus infection or Pneumocystis carinii pneumonia has been  excluded at this time.   Our plan is to continue on IV antibiotics cefepime and Levaquin.  I think if  tomorrow there are no positive blood cultures, we can discontinue  vancomycin.   The patient has really done fairly  well and will recommend being in the  hospital for just a few more days on the IV antibiotics.  The patient was  agreeable to this plan.  We appreciate Dr. Juanetta Gosling' input.      Margaretmary Dys, M.D.  Electronically Signed     AM/MEDQ  D:  02/22/2006  T:  02/22/2006  Job:  [id number]
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
## 2011-05-17 ENCOUNTER — Ambulatory Visit (HOSPITAL_COMMUNITY)
Admission: RE | Admit: 2011-05-17 | Discharge: 2011-05-18 | Disposition: A | Discharge status: Home / Self Care | Payer: Medicare Other | Source: Ambulatory Visit | Attending: Neurosurgery | Admitting: Neurosurgery

## 2011-05-17 ENCOUNTER — Ambulatory Visit (HOSPITAL_COMMUNITY): Payer: Medicare Other

## 2011-05-17 DIAGNOSIS — I10 Essential (primary) hypertension: Secondary | ICD-10-CM | POA: Insufficient documentation

## 2011-05-17 DIAGNOSIS — M5126 Other intervertebral disc displacement, lumbar region: Secondary | ICD-10-CM | POA: Insufficient documentation

## 2011-05-17 DIAGNOSIS — F17200 Nicotine dependence, unspecified, uncomplicated: Secondary | ICD-10-CM | POA: Insufficient documentation

## 2011-05-17 DIAGNOSIS — M129 Arthropathy, unspecified: Secondary | ICD-10-CM | POA: Insufficient documentation

## 2011-05-17 LAB — BASIC METABOLIC PANEL
BUN: 8 mg/dL (ref 6–23)
Chloride: 102 mEq/L (ref 96–112)
Glucose, Bld: 78 mg/dL (ref 70–99)
Potassium: 4.9 mEq/L (ref 3.5–5.1)
Sodium: 139 mEq/L (ref 135–145)

## 2011-05-17 LAB — CBC
HCT: 47.1 % (ref 39.0–52.0)
Hemoglobin: 16.8 g/dL (ref 13.0–17.0)
RBC: 5.72 MIL/uL (ref 4.22–5.81)
WBC: 5.2 10*3/uL (ref 4.0–10.5)

## 2011-05-17 LAB — SURGICAL PCR SCREEN: Staphylococcus aureus: POSITIVE — AB

## 2011-06-12 NOTE — Op Note (Signed)
[name], [name] NO.:  [id number]  MEDICAL RECORD NO.:  [id number]  LOCATION:  3534                         FACILITY:  [facility]  PHYSICIAN:  Coletta Memos, M.D.     DATE OF BIRTH:  Apr 19, 1961  DATE OF PROCEDURE:  05/17/2011 DATE OF DISCHARGE:                              OPERATIVE REPORT   PREOPERATIVE DIAGNOSES: 1. Far lateral displaced disk, L3-4 on left. 2. Left L3 radiculopathy.  POSTOPERATIVE DIAGNOSES: 1. Far lateral displaced disk, L3-4 on left. 2. Left L3 radiculopathy.  PROCEDURE:  Far lateral diskectomy L3-4 with microdissection.  COMPLICATIONS:  None.  SURGEON:  Coletta Memos, MD  ASSISTANT:  Stefani Dama, MD  INDICATIONS:  [name] presented with pain in the left lower extremity, which did not respond to conservative measures.  I therefore offered and he agreed to undergo operative decompression.  His back was prepped and he was draped in a sterile fashion.  I opened the skin with a #10 blade and took this down to the thoracolumbar fascia.  I then exposed the lamina of L3, L4, and L2.  I then expose the pars after identifying interarticularis of the L3.  I used a drill to remove the lateral portion of the pars.  I then was able to get underneath the pars and expose the left L3 root.  I also exposed the disk.  I brought the microscope into the operative field and with microdissection I opened the disk space with Dr. Verlee Rossetti help.  In microdissection, we performed a diskectomy and decompressed the left L3 root.  At final inspection, I felt that his much as could be done was done.  There was still a bone spur, but did not appear to be putting any pressure whatsoever on the nerve root.  The nerve was well decompressed.  I irrigated the wound.  I infiltrated the resection site with fentanyl Depo-Medrol mixture.  I closed the wound in layered fashion using Vicryl sutures to reapproximate the thoracolumbar subcutaneous and subcuticular  layers.  I used Dermabond for sterile dressing.          ______________________________ Coletta Memos, M.D.    KC/MEDQ  D:  05/17/2011  T:  05/18/2011  Job:  [id number]  Electronically Signed by Coletta Memos M.D. on 06/12/2011 04:17:42 PM

## 2011-06-12 NOTE — Discharge Summary (Signed)
[name], [name] NO.:  [id number]  MEDICAL RECORD NO.:  [id number]  LOCATION:  3534                         FACILITY:  [facility]  PHYSICIAN:  Coletta Memos, M.D.     DATE OF BIRTH:  1961-05-19  DATE OF ADMISSION:  05/17/2011 DATE OF DISCHARGE:                              DISCHARGE SUMMARY   ADMITTING DIAGNOSIS:  Far-lateral displaced disk L3-4, left L3 radiculopathy.  DISCHARGE DIAGNOSIS:  Far-lateral displaced disk L3-4, left L3 radiculopathy.  PROCEDURE:  Left L3-4 far-lateral diskectomy with microdissection.  COMPLICATIONS:  None.  DISCHARGE STATUS:  Alive and well.  Discharge diagnosis also include history of cerebrovascular accident and subdural hematoma treated via craniotomy in the past.  [name] was admitted secondary to persistent pain, relieved with conservative measures due to a displaced disk at L3-4 and far-lateral position.  I, therefore offered, he agreed to undergo operative decompression.  His operation went well.  Postop, he is voiding, tolerating a regular diet, ambulating without difficulty.  He was given Norco and Flexeril at discharge.  Wound clean and dry, no signs of infection.  He will see me again in 3-4 weeks given instructions, no heavy lifting, bending, or twisting.  No driving for 10 days.          ______________________________ Coletta Memos, M.D.     KC/MEDQ  D:  05/17/2011  T:  05/18/2011  Job:  [id number]  Electronically Signed by Coletta Memos M.D. on 06/12/2011 04:17:39 PM

## 2011-07-10 LAB — URINE MICROSCOPIC-ADD ON

## 2011-07-10 LAB — URINALYSIS, ROUTINE W REFLEX MICROSCOPIC
Glucose, UA: NEGATIVE
Ketones, ur: NEGATIVE
Leukocytes, UA: NEGATIVE
Nitrite: NEGATIVE
Protein, ur: NEGATIVE
pH: 5.5

## 2011-07-11 LAB — URINALYSIS, ROUTINE W REFLEX MICROSCOPIC
Bilirubin Urine: NEGATIVE
Glucose, UA: NEGATIVE
Hgb urine dipstick: NEGATIVE
Ketones, ur: NEGATIVE
Nitrite: NEGATIVE
Protein, ur: NEGATIVE
Specific Gravity, Urine: 1.025
Urobilinogen, UA: 1
pH: 5.5

## 2011-12-29 ENCOUNTER — Encounter (HOSPITAL_COMMUNITY): Payer: Self-pay | Admitting: Emergency Medicine

## 2011-12-29 ENCOUNTER — Emergency Department (HOSPITAL_COMMUNITY)
Admission: EM | Admit: 2011-12-29 | Discharge: 2011-12-29 | Disposition: A | Discharge status: Home / Self Care | Payer: Medicare Other | Attending: Emergency Medicine | Admitting: Emergency Medicine

## 2011-12-29 DIAGNOSIS — Z5189 Encounter for other specified aftercare: Secondary | ICD-10-CM | POA: Insufficient documentation

## 2011-12-29 DIAGNOSIS — X19XXXA Contact with other heat and hot substances, initial encounter: Secondary | ICD-10-CM | POA: Insufficient documentation

## 2011-12-29 DIAGNOSIS — I10 Essential (primary) hypertension: Secondary | ICD-10-CM | POA: Insufficient documentation

## 2011-12-29 DIAGNOSIS — T24039A Burn of unspecified degree of unspecified lower leg, initial encounter: Secondary | ICD-10-CM | POA: Insufficient documentation

## 2011-12-29 DIAGNOSIS — Y92009 Unspecified place in unspecified non-institutional (private) residence as the place of occurrence of the external cause: Secondary | ICD-10-CM | POA: Insufficient documentation

## 2011-12-29 HISTORY — DX: Essential (primary) hypertension: I10

## 2011-12-29 MED ORDER — ONDANSETRON HCL 4 MG PO TABS
4.0000 mg | ORAL_TABLET | Freq: Once | ORAL | Status: AC
  Administered 2011-12-29: 4 mg via ORAL
  Filled 2011-12-29: qty 1

## 2011-12-29 MED ORDER — HYDROCODONE-ACETAMINOPHEN 5-325 MG PO TABS: 1.0000 | ORAL_TABLET | ORAL | Status: AC | PRN

## 2011-12-29 MED ORDER — DOXYCYCLINE HYCLATE 100 MG PO CAPS: 100.0000 mg | ORAL_CAPSULE | Freq: Two times a day (BID) | ORAL | Status: AC

## 2011-12-29 MED ORDER — HYDROCODONE-ACETAMINOPHEN 5-325 MG PO TABS
2.0000 | ORAL_TABLET | Freq: Once | ORAL | Status: AC
  Administered 2011-12-29: 2 via ORAL
  Filled 2011-12-29: qty 2

## 2011-12-29 MED ORDER — DOXYCYCLINE HYCLATE 100 MG PO TABS
100.0000 mg | ORAL_TABLET | Freq: Once | ORAL | Status: AC
  Administered 2011-12-29: 100 mg via ORAL
  Filled 2011-12-29: qty 1

## 2011-12-29 NOTE — ED Notes (Signed)
Pt a/ox4. resp even and unlabored. NAD at this time. D/C instructions reviewed with pt. Pt verbalized understanding. Pt ambulated to lobby with steady gate.  

## 2011-12-29 NOTE — Discharge Instructions (Signed)
Please soak the left leg in a double warm salt water daily for 15 minutes until wound heals. Please apply clean dressing daily. Doxycycline twice daily with a meal. Tylenol for mild soreness, Norco for more severe pain or soreness. This medication may cause drowsiness, please use with caution. Please see your primary physician for followup and recheck. Please return to the emergency department if any urgent or emergent problems.Wound Check Your wound appears healthy today. Your wound will heal gradually over time. Eventually a scar will form that will fade with time. FACTORS THAT AFFECT SCAR FORMATION:  People differ in the severity in which they scar.   Scar severity varies according to location, size, and the traits you inherited from your parents (genetic predisposition).   Irritation to the wound from infection, rubbing, or chemical exposure will increase the amount of scar formation.  HOME CARE INSTRUCTIONS   If you were given a dressing, you should change it at least once a day or as instructed by your caregiver. If the bandage sticks, soak it off with a solution of hydrogen peroxide.   If the bandage becomes wet, dirty, or develops a bad smell, change it as soon as possible.   Look for signs of infection.   Only take over-the-counter or prescription medicines for pain, discomfort, or fever as directed by your caregiver.  SEEK IMMEDIATE MEDICAL CARE IF:   You have redness, swelling, or increasing pain in the wound.   You notice pus coming from the wound.   You have a fever.   You notice a bad smell coming from the wound or dressing.  Document Released: 07/06/2004 Document Revised: 09/19/2011 Document Reviewed: 09/30/2005 [facility] Patient Information 2012 [place], [place].
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
## 2011-12-30 NOTE — ED Provider Notes (Signed)
Medical screening examination/treatment/procedure(s) were performed by non-physician practitioner and as supervising physician I was immediately available for consultation/collaboration.    Lanaysia Fritchman L Roemello Speyer, MD 12/30/11 1037 

## 2012-02-19 ENCOUNTER — Emergency Department (HOSPITAL_COMMUNITY)
Admission: EM | Admit: 2012-02-19 | Discharge: 2012-02-19 | Disposition: A | Discharge status: Home / Self Care | Payer: Medicare Other | Attending: Emergency Medicine | Admitting: Emergency Medicine

## 2012-02-19 ENCOUNTER — Encounter (HOSPITAL_COMMUNITY): Payer: Self-pay | Admitting: *Deleted

## 2012-02-19 ENCOUNTER — Emergency Department (HOSPITAL_COMMUNITY): Payer: Medicare Other

## 2012-02-19 DIAGNOSIS — I10 Essential (primary) hypertension: Secondary | ICD-10-CM | POA: Insufficient documentation

## 2012-02-19 DIAGNOSIS — M5030 Other cervical disc degeneration, unspecified cervical region: Secondary | ICD-10-CM | POA: Insufficient documentation

## 2012-02-19 DIAGNOSIS — M47812 Spondylosis without myelopathy or radiculopathy, cervical region: Secondary | ICD-10-CM | POA: Insufficient documentation

## 2012-02-19 DIAGNOSIS — M25519 Pain in unspecified shoulder: Secondary | ICD-10-CM

## 2012-02-19 DIAGNOSIS — W1789XA Other fall from one level to another, initial encounter: Secondary | ICD-10-CM | POA: Insufficient documentation

## 2012-02-19 DIAGNOSIS — S139XXA Sprain of joints and ligaments of unspecified parts of neck, initial encounter: Secondary | ICD-10-CM

## 2012-02-19 DIAGNOSIS — M79609 Pain in unspecified limb: Secondary | ICD-10-CM | POA: Insufficient documentation

## 2012-02-19 MED ORDER — CYCLOBENZAPRINE HCL 10 MG PO TABS: 10.0000 mg | ORAL_TABLET | Freq: Three times a day (TID) | ORAL | Status: AC | PRN

## 2012-02-19 MED ORDER — OXYCODONE-ACETAMINOPHEN 5-325 MG PO TABS
1.0000 | ORAL_TABLET | Freq: Once | ORAL | Status: AC
  Administered 2012-02-19: 1 via ORAL
  Filled 2012-02-19: qty 1

## 2012-02-19 MED ORDER — OXYCODONE-ACETAMINOPHEN 5-325 MG PO TABS: 1.0000 | ORAL_TABLET | ORAL | Status: AC | PRN
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
## 2012-02-19 NOTE — Discharge Instructions (Signed)
Cervical Sprain A cervical sprain is an injury in the neck in which the ligaments are stretched or torn. The ligaments are the tissues that hold the bones of the neck (vertebrae) in place.Cervical sprains can range from very mild to very severe. Most cervical sprains get better in 1 to 3 weeks, but it depends on the cause and extent of the injury. Severe cervical sprains can cause the neck vertebrae to be unstable. This can lead to damage of the spinal cord and can result in serious nervous system problems. Your caregiver will determine whether your cervical sprain is mild or severe. CAUSES  Severe cervical sprains may be caused by:  Contact sport injuries (football, rugby, wrestling, hockey, auto racing, gymnastics, diving, martial arts, boxing).   Motor vehicle collisions.   Whiplash injuries. This means the neck is forcefully whipped backward and forward.   Falls.  Mild cervical sprains may be caused by:   Awkward positions, such as cradling a telephone between your ear and shoulder.   Sitting in a chair that does not offer proper support.   Working at a poorly designed computer station.   Activities that require looking up or down for long periods of time.  SYMPTOMS   Pain, soreness, stiffness, or a burning sensation in the front, back, or sides of the neck. This discomfort may develop immediately after injury or it may develop slowly and not begin for 24 hours or more after an injury.   Pain or tenderness directly in the middle of the back of the neck.   Shoulder or upper back pain.   Limited ability to move the neck.   Headache.   Dizziness.   Weakness, numbness, or tingling in the hands or arms.   Muscle spasms.   Difficulty swallowing or chewing.   Tenderness and swelling of the neck.  DIAGNOSIS  Most of the time, your caregiver can diagnose this problem by taking your history and doing a physical exam. Your caregiver will ask about any known problems, such as  arthritis in the neck or a previous neck injury. X-rays may be taken to find out if there are any other problems, such as problems with the bones of the neck. However, an X-ray often does not reveal the full extent of a cervical sprain. Other tests such as a computed tomography (CT) scan or magnetic resonance imaging (MRI) may be needed. TREATMENT  Treatment depends on the severity of the cervical sprain. Mild sprains can be treated with rest, keeping the neck in place (immobilization), and pain medicines. Severe cervical sprains need immediate immobilization and an appointment with an orthopedist or neurosurgeon. Several treatment options are available to help with pain, muscle spasms, and other symptoms. Your caregiver may prescribe:  Medicines, such as pain relievers, numbing medicines, or muscle relaxants.   Physical therapy. This can include stretching exercises, strengthening exercises, and posture training. Exercises and improved posture can help stabilize the neck, strengthen muscles, and help stop symptoms from returning.   A neck collar to be worn for short periods of time. Often, these collars are worn for comfort. However, certain collars may be worn to protect the neck and prevent further worsening of a serious cervical sprain.  HOME CARE INSTRUCTIONS   Put ice on the injured area.   Put ice in a plastic bag.   Place a towel between your skin and the bag.   Leave the ice on for 15 to 20 minutes, 3 to 4 times a day.     Only take over-the-counter or prescription medicines for pain, discomfort, or fever as directed by your caregiver.   Keep all follow-up appointments as directed by your caregiver.   Keep all physical therapy appointments as directed by your caregiver.   If a neck collar is prescribed, wear it as directed by your caregiver.   Do not drive while wearing a neck collar.   Make any needed adjustments to your work station to promote good posture.   Avoid positions  and activities that make your symptoms worse.   Warm up and stretch before being active to help prevent problems.  SEEK MEDICAL CARE IF:   Your pain is not controlled with medicine.   You are unable to decrease your pain medicine over time as planned.   Your activity level is not improving as expected.  SEEK IMMEDIATE MEDICAL CARE IF:   You develop any bleeding, stomach upset, or signs of an allergic reaction to your medicine.   Your symptoms get worse.   You develop new, unexplained symptoms.   You have numbness, tingling, weakness, or paralysis in any part of your body.  MAKE SURE YOU:   Understand these instructions.   Will watch your condition.   Will get help right away if you are not doing well or get worse.  Document Released: 07/28/2007 Document Revised: 09/19/2011 Document Reviewed: 07/03/2011 [facility] Patient Information 2012 [place], [place].Muscle Strain A muscle strain (pulled muscle) happens when a muscle is over-stretched. Recovery usually takes 5 to 6 weeks.  HOME CARE   Put ice on the injured area.   Put ice in a plastic bag.   Place a towel between your skin and the bag.   Leave the ice on for 15 to 20 minutes at a time, every hour for the first 2 days.   Do not use the muscle for several days or until your doctor says you can. Do not use the muscle if you have pain.   Wrap the injured area with an elastic bandage for comfort. Do not put it on too tightly.   Only take medicine as told by your doctor.   Warm up before exercise. This helps prevent muscle strains.  GET HELP RIGHT AWAY IF:  There is increased pain or puffiness (swelling) in the affected area. MAKE SURE YOU:   Understand these instructions.   Will watch your condition.   Will get help right away if you are not doing well or get worse.  Document Released: 07/09/2008 Document Revised: 09/19/2011 Document Reviewed: 07/09/2008 [facility] Patient Information 2012 [place], [place].

## 2012-02-19 NOTE — ED Notes (Signed)
Pt states neck, bilateral shoulder and left arm pain began 2 days ago with no known injury.

## 2012-02-21 NOTE — ED Provider Notes (Signed)
Medical screening examination/treatment/procedure(s) were performed by non-physician practitioner and as supervising physician I was immediately available for consultation/collaboration. Devoria Albe, MD, Armando Gang   Ward Givens, MD 02/21/12 [phone]

## 2012-05-10 ENCOUNTER — Emergency Department (HOSPITAL_COMMUNITY)
Admission: EM | Admit: 2012-05-10 | Discharge: 2012-05-10 | Disposition: A | Discharge status: Home / Self Care | Payer: Medicare Other | Attending: Emergency Medicine | Admitting: Emergency Medicine

## 2012-05-10 ENCOUNTER — Encounter (HOSPITAL_COMMUNITY): Payer: Self-pay | Admitting: *Deleted

## 2012-05-10 DIAGNOSIS — B353 Tinea pedis: Secondary | ICD-10-CM | POA: Insufficient documentation

## 2012-05-10 DIAGNOSIS — B351 Tinea unguium: Secondary | ICD-10-CM | POA: Insufficient documentation

## 2012-05-10 DIAGNOSIS — I10 Essential (primary) hypertension: Secondary | ICD-10-CM | POA: Insufficient documentation

## 2012-05-10 DIAGNOSIS — F17200 Nicotine dependence, unspecified, uncomplicated: Secondary | ICD-10-CM | POA: Insufficient documentation

## 2012-05-10 MED ORDER — HYDROCODONE-ACETAMINOPHEN 5-325 MG PO TABS: 1.0000 | ORAL_TABLET | ORAL | Status: AC | PRN

## 2012-05-10 MED ORDER — HYDROCODONE-ACETAMINOPHEN 5-325 MG PO TABS
1.0000 | ORAL_TABLET | Freq: Once | ORAL | Status: AC
  Administered 2012-05-10: 1 via ORAL
  Filled 2012-05-10: qty 1

## 2012-05-10 MED ORDER — KETOCONAZOLE 2 % EX CREA: TOPICAL_CREAM | Freq: Two times a day (BID) | CUTANEOUS | Status: DC

## 2012-05-10 MED ORDER — CEPHALEXIN 500 MG PO CAPS: 500.0000 mg | ORAL_CAPSULE | Freq: Four times a day (QID) | ORAL | Status: AC

## 2012-05-10 MED ORDER — CEPHALEXIN 500 MG PO CAPS
500.0000 mg | ORAL_CAPSULE | Freq: Once | ORAL | Status: AC
  Administered 2012-05-10: 500 mg via ORAL
  Filled 2012-05-10: qty 1

## 2012-05-10 NOTE — ED Notes (Signed)
Pt c/o swelling, pain, itching  to bilateral toes, pt states that it started two months ago, has use otc fungus cream without any relief.

## 2012-05-14 NOTE — ED Provider Notes (Signed)
History     CSN: [id number]  Arrival date & time 05/10/12  1718   First MD Initiated Contact with Patient 05/10/12 1729      Chief Complaint  Patient presents with  . Toe Pain    (Consider location/radiation/quality/duration/timing/severity/associated sxs/prior treatment) HPI Comments: [name] presents with a 2 month history of bilateral swelling,  Pain, itching and now drainage from around and between his toes with a worsening rash.  In addition, he describes a long standing history of thickened, blackened great left toenail which has fallen off at least, once, only to grow back thicker.  He has tried otc fungal creams without relief.  He denies fevers or chills.  He has not been evaluated by his pcp for this condition. He has no significant medical history, but does have a strong family history of diabetes.  The history is provided by the patient.    Past Medical History  Diagnosis Date  . Hypertension     Past Surgical History  Procedure Date  . Knee surgery   . Brain surgery   . Back surgery     No family history on file.  History  Substance Use Topics  . Smoking status: Current Everyday Smoker  . Smokeless tobacco: Not on file  . Alcohol Use: Yes     occasional      Review of Systems  Constitutional: Negative for fever and chills.  Respiratory: Negative for shortness of breath and wheezing.   Skin: Positive for rash.  Neurological: Negative for numbness.    Allergies  Review of patient's allergies indicates no known allergies.  Home Medications   Current Outpatient Rx  Name Route Sig Dispense Refill  . HYDROCODONE-ACETAMINOPHEN 10-650 MG PO TABS Oral Take 1 tablet by mouth every 4 (four) hours as needed. pain    . LISINOPRIL-HYDROCHLOROTHIAZIDE 20-12.5 MG PO TABS Oral Take 1 tablet by mouth daily.    . CEPHALEXIN 500 MG PO CAPS Oral Take 1 capsule (500 mg total) by mouth 4 (four) times daily. 40 capsule 0  . HYDROCODONE-ACETAMINOPHEN 5-325 MG  PO TABS Oral Take 1 tablet by mouth every 4 (four) hours as needed for pain. 20 tablet 0  . KETOCONAZOLE 2 % EX CREA Topical Apply topically 2 (two) times daily. 60 g 0    BP 171/102  Pulse 65  Temp 97.4 F (36.3 C)  Resp 20  Ht 5' 6.5" (1.689 m)  Wt 222 lb (100.699 kg)  BMI 35.30 kg/m2  SpO2 98%  Physical Exam  Constitutional: He appears well-developed and well-nourished. No distress.  HENT:  Head: Normocephalic.  Neck: Neck supple.  Cardiovascular: Normal rate.   Pulmonary/Chest: Effort normal. He has no wheezes.  Musculoskeletal: Normal range of motion. He exhibits no edema.  Skin: Rash noted.       Scaling,  Diffuse rash between most of the intertriginous toe spaces both feet,  With spreading lesions on distal dorsal feet.  He does have serous drainage from several lesions on left dorsal foot without fluctance, significant induration.  Left great toenail grossly thickened and darkened, right great toe to lesser extent.  No red streaking.  ED Course  Procedures (including critical care time)   Labs Reviewed  GLUCOSE, CAPILLARY  LAB REPORT - SCANNED   No results found.   1. Tinea pedis   2. Onychomycosis    cbg 93   MDM  Suspect bacterial skin infection in addition to obvious tinea pedis/onychomycosis.  Pt prescribed ketocnazole 2%,  Keflex.  Instructed pt that he must f/u with pcp this week and that he will need oral tx as well to completely eliminate the nail infection which his pcp can prescribe and follow.  Pt understands.    Lab reviewed.        Burgess Amor, [place] 05/14/12 1353

## 2012-05-15 NOTE — ED Provider Notes (Signed)
Medical screening examination/treatment/procedure(s) were performed by non-physician practitioner and as supervising physician I was immediately available for consultation/collaboration.  Geoffery Lyons, MD 05/15/12 [phone]

## 2012-05-28 ENCOUNTER — Emergency Department (HOSPITAL_COMMUNITY)
Admission: EM | Admit: 2012-05-28 | Discharge: 2012-05-28 | Disposition: A | Discharge status: Home / Self Care | Payer: Medicare Other | Attending: Emergency Medicine | Admitting: Emergency Medicine

## 2012-05-28 ENCOUNTER — Encounter (HOSPITAL_COMMUNITY): Payer: Self-pay | Admitting: *Deleted

## 2012-05-28 ENCOUNTER — Emergency Department (HOSPITAL_COMMUNITY): Payer: Medicare Other

## 2012-05-28 DIAGNOSIS — R062 Wheezing: Secondary | ICD-10-CM | POA: Insufficient documentation

## 2012-05-28 DIAGNOSIS — F17200 Nicotine dependence, unspecified, uncomplicated: Secondary | ICD-10-CM | POA: Insufficient documentation

## 2012-05-28 DIAGNOSIS — S2239XA Fracture of one rib, unspecified side, initial encounter for closed fracture: Secondary | ICD-10-CM | POA: Insufficient documentation

## 2012-05-28 DIAGNOSIS — Z79899 Other long term (current) drug therapy: Secondary | ICD-10-CM | POA: Insufficient documentation

## 2012-05-28 DIAGNOSIS — W1809XA Striking against other object with subsequent fall, initial encounter: Secondary | ICD-10-CM | POA: Insufficient documentation

## 2012-05-28 DIAGNOSIS — Y92009 Unspecified place in unspecified non-institutional (private) residence as the place of occurrence of the external cause: Secondary | ICD-10-CM | POA: Insufficient documentation

## 2012-05-28 DIAGNOSIS — S2232XA Fracture of one rib, left side, initial encounter for closed fracture: Secondary | ICD-10-CM

## 2012-05-28 DIAGNOSIS — I10 Essential (primary) hypertension: Secondary | ICD-10-CM | POA: Insufficient documentation

## 2012-05-28 MED ORDER — METHOCARBAMOL 500 MG PO TABS: ORAL_TABLET | ORAL | Status: DC

## 2012-05-28 MED ORDER — ONDANSETRON HCL 4 MG PO TABS
4.0000 mg | ORAL_TABLET | Freq: Once | ORAL | Status: AC
  Administered 2012-05-28: 4 mg via ORAL
  Filled 2012-05-28: qty 1

## 2012-05-28 MED ORDER — ALBUTEROL SULFATE HFA 108 (90 BASE) MCG/ACT IN AERS
2.0000 | INHALATION_SPRAY | RESPIRATORY_TRACT | Status: DC
  Administered 2012-05-28 (×2): 2 via RESPIRATORY_TRACT
  Filled 2012-05-28: qty 6.7

## 2012-05-28 MED ORDER — IBUPROFEN 800 MG PO TABS
800.0000 mg | ORAL_TABLET | Freq: Once | ORAL | Status: AC
  Administered 2012-05-28: 800 mg via ORAL
  Filled 2012-05-28: qty 1

## 2012-05-28 MED ORDER — OXYCODONE-ACETAMINOPHEN 5-325 MG PO TABS: 1.0000 | ORAL_TABLET | ORAL | Status: AC | PRN

## 2012-05-28 MED ORDER — OXYCODONE-ACETAMINOPHEN 5-325 MG PO TABS
1.0000 | ORAL_TABLET | Freq: Once | ORAL | Status: AC
  Administered 2012-05-28: 1 via ORAL
  Filled 2012-05-28: qty 1

## 2012-05-28 MED ORDER — MELOXICAM 7.5 MG PO TABS: 7.5000 mg | ORAL_TABLET | Freq: Every day | ORAL | Status: DC

## 2012-05-28 MED ORDER — METHOCARBAMOL 500 MG PO TABS
1000.0000 mg | ORAL_TABLET | Freq: Once | ORAL | Status: AC
  Administered 2012-05-28: 1000 mg via ORAL
  Filled 2012-05-28: qty 2

## 2012-05-28 MED ORDER — METHYLPREDNISOLONE SODIUM SUCC 125 MG IJ SOLR
125.0000 mg | Freq: Once | INTRAMUSCULAR | Status: AC
  Administered 2012-05-28: 125 mg via INTRAMUSCULAR
  Filled 2012-05-28: qty 2

## 2012-05-28 NOTE — ED Notes (Signed)
Pt c/o left sided rib pain that began 2 days ago after a fall that occurred while taking a shower. Pt states pain is worse with any movement and with deep breathing/coughing.
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
## 2012-05-28 NOTE — ED Provider Notes (Signed)
History     CSN: [id number]  Arrival date & time 05/28/12  1108   First MD Initiated Contact with Patient 05/28/12 1324      Chief Complaint  Patient presents with  . Fall    (Consider location/radiation/quality/duration/timing/severity/associated sxs/prior treatment) HPI Comments: Patient states that 2 days ago he fell and injured the left ribs. He had been trying to use home remedies and rest but he states the pain has gotten increasingly worse. The patient denies any injury to the head or any other portions of his body. He denies any hemoptysis. He denies any unusual shortness of breath. He is not on any blood thinning type medications at this time. He presents for evaluation of the left rib area pain.  Patient is a 51 y.o. male presenting with fall. The history is provided by the patient.  Fall Pertinent negatives include no abdominal pain and no hematuria.    Past Medical History  Diagnosis Date  . Hypertension     Past Surgical History  Procedure Date  . Knee surgery   . Back surgery   . Brain surgery     History reviewed. No pertinent family history.  History  Substance Use Topics  . Smoking status: Current Everyday Smoker  . Smokeless tobacco: Not on file  . Alcohol Use: Yes     occasional      Review of Systems  Constitutional: Negative for activity change.       All ROS Neg except as noted in HPI  HENT: Negative for nosebleeds and neck pain.   Eyes: Negative for photophobia and discharge.  Respiratory: Positive for cough and wheezing. Negative for shortness of breath.   Cardiovascular: Negative for chest pain and palpitations.  Gastrointestinal: Negative for abdominal pain and blood in stool.  Genitourinary: Negative for dysuria, frequency and hematuria.  Musculoskeletal: Positive for back pain and arthralgias.  Skin: Negative.   Neurological: Positive for weakness. Negative for dizziness, seizures and speech difficulty.  Psychiatric/Behavioral:  Negative for hallucinations and confusion.    Allergies  Review of patient's allergies indicates no known allergies.  Home Medications   Current Outpatient Rx  Name Route Sig Dispense Refill  . LISINOPRIL 20 MG PO TABS Oral Take 20 mg by mouth daily.      BP 167/104  Pulse 84  Temp 98.2 F (36.8 C) (Oral)  Resp 18  Ht 5' 6.5" (1.689 m)  Wt 220 lb (99.791 kg)  BMI 34.98 kg/m2  SpO2 98%  Physical Exam  Nursing note and vitals reviewed. Constitutional: He is oriented to person, place, and time. He appears well-developed and well-nourished.  Non-toxic appearance.  HENT:  Head: Normocephalic.  Right Ear: Tympanic membrane and external ear normal.  Left Ear: Tympanic membrane and external ear normal.  Eyes: EOM and lids are normal. Pupils are equal, round, and reactive to light.  Neck: Normal range of motion. Neck supple. Carotid bruit is not present.  Cardiovascular: Normal rate, regular rhythm, normal heart sounds, intact distal pulses and normal pulses.   Pulmonary/Chest: No respiratory distress. He has wheezes. He has rhonchi.       Left mid  chest wall pain to palpation and with deep breathing. No palpable deformity appreciated. No crepitus present.  Abdominal: Soft. Bowel sounds are normal. There is no tenderness. There is no guarding.  Musculoskeletal: Normal range of motion.  Lymphadenopathy:       Head (right side): No submandibular adenopathy present.       Head (left  side): No submandibular adenopathy present.    He has no cervical adenopathy.  Neurological: He is alert and oriented to person, place, and time. He has normal strength. No cranial nerve deficit or sensory deficit.       Weakness of the left upper and lower extremity, not new for this patient.  Skin: Skin is warm and dry.  Psychiatric: He has a normal mood and affect. His speech is normal.    ED Course  Procedures (including critical care time)  Labs Reviewed - No data to display Dg Ribs Unilateral  W/chest Left  05/28/2012  *RADIOLOGY REPORT*  Clinical Data: Left chest pain and shortness of breath following fall.  LEFT RIBS AND CHEST - 3+ VIEW  Comparison: 02/19/2012 and prior chest radiographs.  Findings: The cardiomediastinal silhouette is stable.  Mild right hilar prominence is unchanged from 2006.  There is no evidence of focal airspace disease, pulmonary edema, suspicious pulmonary nodule/mass, pleural effusion, or pneumothorax.  A possible fracture of the posterior lateral left eighth rib is noted.  IMPRESSION: Possible posterior lateral left eighth rib fracture.  No evidence of pleural effusion or pneumothorax.  No other significant abnormalities identified.  Original Report Authenticated By: Rosendo Gros, M.D.     No diagnosis found.    MDM  I have reviewed nursing notes, vital signs, and all appropriate lab and imaging results for this patient. 2 days ago the patient sustained a fall in the bathtub and injured the left ribs. X-ray of the left ribs and chest reveals a fracture of the eighth rib. The lungs are intact. The patient is ambulatory and is able to speak in complete sentences. The plan at this time is for the patient to have prescription for Mobic 7.5 mg, and Percocet one every 4 hours as needed for pain #24. Robaxin 3 times daily for spasm. The patient was noted to have rhonchi and a few wheezes present and was given an albuterol inhaler to use every 4 hours. He will follow up with Dr Loleta Chance in the office.        Kathie Dike, [place] 05/28/12 1422

## 2012-05-28 NOTE — ED Provider Notes (Signed)
Medical screening examination/treatment/procedure(s) were performed by non-physician practitioner and as supervising physician I was immediately available for consultation/collaboration.   Benny Lennert, MD 05/28/12 1550

## 2012-07-21 ENCOUNTER — Encounter (HOSPITAL_COMMUNITY): Payer: Self-pay

## 2012-07-21 ENCOUNTER — Emergency Department (HOSPITAL_COMMUNITY)
Admission: EM | Admit: 2012-07-21 | Discharge: 2012-07-21 | Disposition: A | Discharge status: Home / Self Care | Payer: Medicare Other | Attending: Emergency Medicine | Admitting: Emergency Medicine

## 2012-07-21 ENCOUNTER — Emergency Department (HOSPITAL_COMMUNITY): Payer: Medicare Other

## 2012-07-21 DIAGNOSIS — S161XXA Strain of muscle, fascia and tendon at neck level, initial encounter: Secondary | ICD-10-CM

## 2012-07-21 DIAGNOSIS — S8990XA Unspecified injury of unspecified lower leg, initial encounter: Secondary | ICD-10-CM | POA: Insufficient documentation

## 2012-07-21 DIAGNOSIS — S61209A Unspecified open wound of unspecified finger without damage to nail, initial encounter: Secondary | ICD-10-CM | POA: Insufficient documentation

## 2012-07-21 DIAGNOSIS — S46909A Unspecified injury of unspecified muscle, fascia and tendon at shoulder and upper arm level, unspecified arm, initial encounter: Secondary | ICD-10-CM | POA: Insufficient documentation

## 2012-07-21 DIAGNOSIS — S40019A Contusion of unspecified shoulder, initial encounter: Secondary | ICD-10-CM

## 2012-07-21 DIAGNOSIS — S99929A Unspecified injury of unspecified foot, initial encounter: Secondary | ICD-10-CM | POA: Insufficient documentation

## 2012-07-21 DIAGNOSIS — S60419A Abrasion of unspecified finger, initial encounter: Secondary | ICD-10-CM

## 2012-07-21 DIAGNOSIS — S4980XA Other specified injuries of shoulder and upper arm, unspecified arm, initial encounter: Secondary | ICD-10-CM | POA: Insufficient documentation

## 2012-07-21 MED ORDER — IBUPROFEN 400 MG PO TABS
400.0000 mg | ORAL_TABLET | Freq: Once | ORAL | Status: AC
  Administered 2012-07-21: 400 mg via ORAL
  Filled 2012-07-21: qty 1
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
## 2012-10-12 ENCOUNTER — Emergency Department (HOSPITAL_COMMUNITY)
Admission: EM | Admit: 2012-10-12 | Discharge: 2012-10-12 | Discharge status: Against Medical Advice | Payer: Medicare Other

## 2012-10-13 ENCOUNTER — Emergency Department (HOSPITAL_COMMUNITY)
Admission: EM | Admit: 2012-10-13 | Discharge: 2012-10-13 | Disposition: A | Discharge status: Home / Self Care | Payer: Medicare Other | Attending: Emergency Medicine | Admitting: Emergency Medicine

## 2012-10-13 ENCOUNTER — Encounter (HOSPITAL_COMMUNITY): Payer: Self-pay | Admitting: Emergency Medicine

## 2012-10-13 DIAGNOSIS — I10 Essential (primary) hypertension: Secondary | ICD-10-CM | POA: Insufficient documentation

## 2012-10-13 DIAGNOSIS — F17200 Nicotine dependence, unspecified, uncomplicated: Secondary | ICD-10-CM | POA: Insufficient documentation

## 2012-10-13 DIAGNOSIS — Y838 Other surgical procedures as the cause of abnormal reaction of the patient, or of later complication, without mention of misadventure at the time of the procedure: Secondary | ICD-10-CM | POA: Insufficient documentation

## 2012-10-13 DIAGNOSIS — T148XXA Other injury of unspecified body region, initial encounter: Secondary | ICD-10-CM

## 2012-10-13 DIAGNOSIS — Z79899 Other long term (current) drug therapy: Secondary | ICD-10-CM | POA: Insufficient documentation

## 2012-10-13 DIAGNOSIS — IMO0002 Reserved for concepts with insufficient information to code with codable children: Secondary | ICD-10-CM | POA: Insufficient documentation

## 2012-10-13 MED ORDER — SULFAMETHOXAZOLE-TRIMETHOPRIM 800-160 MG PO TABS: 1.0000 | ORAL_TABLET | Freq: Two times a day (BID) | ORAL | Status: DC

## 2012-10-13 MED ORDER — HYDROCODONE-ACETAMINOPHEN 5-325 MG PO TABS: 1.0000 | ORAL_TABLET | Freq: Four times a day (QID) | ORAL | Status: AC | PRN

## 2012-10-13 MED ORDER — HYDROCODONE-ACETAMINOPHEN 5-325 MG PO TABS
1.0000 | ORAL_TABLET | Freq: Once | ORAL | Status: AC
  Administered 2012-10-13: 1 via ORAL
  Filled 2012-10-13: qty 1

## 2012-10-13 MED ORDER — LIDOCAINE HCL (PF) 1 % IJ SOLN: 5.0000 mL | Freq: Once | INTRAMUSCULAR | Status: DC

## 2012-10-13 MED ORDER — LIDOCAINE HCL (PF) 1 % IJ SOLN
INTRAMUSCULAR | Status: AC
  Filled 2012-10-13: qty 5

## 2012-10-13 NOTE — ED Provider Notes (Signed)
Medical screening examination/treatment/procedure(s) were performed by non-physician practitioner and as supervising physician I was immediately available for consultation/collaboration.   Flint Melter, MD 10/13/12 [phone]

## 2012-10-13 NOTE — ED Notes (Signed)
Left great toe infection. Pt states [facility] about 1.5 months ago. Has had problems since. Now thinks its infected.

## 2012-10-13 NOTE — ED Provider Notes (Signed)
History     CSN: [id number]  Arrival date & time 10/13/12  5784   First MD Initiated Contact with Patient 10/13/12 [phone]      Chief Complaint  Patient presents with  . Nail Problem    (Consider location/radiation/quality/duration/timing/severity/associated sxs/prior treatment) HPI Comments: Pt had L great toenail removed a little over a month ago at [facility] because of  fungal nail infection with matrix ablation as well.  On last vist he was placed on an antibiotic which he has completed but without significant improvement and still having pain.  No fever or chills.  States he notes drainage onto sock and that is why he thinks it is infected.  What he describes however is more serous-like.  Not going back for ~ 2 more weeks.  Not diabetic.  The history is provided by the patient. No language interpreter was used.    Past Medical History  Diagnosis Date  . Hypertension     Past Surgical History  Procedure Date  . Knee surgery   . Back surgery   . Brain surgery     History reviewed. No pertinent family history.  History  Substance Use Topics  . Smoking status: Current Every Day Smoker  . Smokeless tobacco: Not on file  . Alcohol Use: Yes     Comment: occasional      Review of Systems  Constitutional: Negative for fever and chills.  Musculoskeletal:       Toe pain   All other systems reviewed and are negative.    Allergies  Review of patient's allergies indicates no known allergies.  Home Medications   Current Outpatient Rx  Name  Route  Sig  Dispense  Refill  . LISINOPRIL 20 MG PO TABS   Oral   Take 20 mg by mouth daily.         Marland Kitchen HYDROCODONE-ACETAMINOPHEN 5-325 MG PO TABS   Oral   Take 1 tablet by mouth every 6 (six) hours as needed for pain.   20 tablet   0   . SULFAMETHOXAZOLE-TRIMETHOPRIM 800-160 MG PO TABS   Oral   Take 1 tablet by mouth every 12 (twelve) hours.   20 tablet   0     BP 169/111  Pulse 67  Temp 97.9 F  (36.6 C) (Oral)  Resp 20  Ht 5' 7.5" (1.715 m)  Wt 225 lb (102.059 kg)  BMI 34.72 kg/m2  SpO2 96%  Physical Exam  Nursing note and vitals reviewed. Constitutional: He is oriented to person, place, and time. He appears well-developed and well-nourished.  HENT:  Head: Normocephalic and atraumatic.  Eyes: EOM are normal.  Neck: Normal range of motion.  Cardiovascular: Normal rate, regular rhythm, normal heart sounds and intact distal pulses.   Pulmonary/Chest: Effort normal and breath sounds normal. No respiratory distress.  Abdominal: Soft. He exhibits no distension. There is no tenderness.  Musculoskeletal:       Left foot: He exhibits decreased range of motion, tenderness and swelling. He exhibits no bony tenderness.       Feet:  Neurological: He is alert and oriented to person, place, and time.  Skin: Skin is warm and dry.  Psychiatric: He has a normal mood and affect. Judgment normal.    ED Course  INCISION AND DRAINAGE Date/Time: 10/13/2012 10:30 AM Performed by: Evalina Field Authorized by: Evalina Field Consent: Verbal consent obtained. Written consent not obtained. Risks and benefits: risks, benefits and alternatives were discussed Consent given by: patient  Patient understanding: patient states understanding of the procedure being performed Patient consent: the patient's understanding of the procedure matches consent given Site marked: the operative site was not marked Imaging studies: imaging studies not available Required items: required blood products, implants, devices, and special equipment available Patient identity confirmed: verbally with patient Time out: Immediately prior to procedure a "time out" was called to verify the correct patient, procedure, equipment, support staff and site/side marked as required. Type: hematoma Anesthesia: local infiltration Local anesthetic: lidocaine 1% without epinephrine Anesthetic total: 1.5 ml Patient sedated:  no Scalpel size: 11 Incision type: single straight Complexity: simple Drainage: bloody Drainage amount: moderate Wound treatment: wound left open Patient tolerance: Patient tolerated the procedure well with no immediate complications. Comments: i was expecting a purulent discharge with incision but returned only blood c/w hematoma.   (including critical care time)  Labs Reviewed - No data to display No results found.   1. Hematoma       MDM  rx-bactrim DS, 20 rx-hydrocodone, 20 F/u with foot center.        Evalina Field, [place] 10/13/12 1137

## 2012-10-13 NOTE — ED Notes (Signed)
EDPA in with pt. [facility] called by nurse to obtain name of antibiotic pt had filled for a fungal infection of his toe. Line busy

## 2012-10-13 NOTE — ED Notes (Signed)
Pt not happy with care. States his toe was injected with water not numbing medication. Cursing states he is in pain the EDPA aware and back in room to speak with pt

## 2013-02-16 ENCOUNTER — Encounter: Payer: Self-pay | Admitting: Orthopedic Surgery

## 2013-02-16 ENCOUNTER — Ambulatory Visit: Payer: Medicare Other | Admitting: Orthopedic Surgery

## 2013-03-18 ENCOUNTER — Ambulatory Visit: Payer: Medicare Other | Admitting: Orthopedic Surgery

## 2013-03-18 ENCOUNTER — Encounter: Payer: Self-pay | Admitting: Orthopedic Surgery

## 2013-05-07 IMAGING — CR DG LUMBAR SPINE 2-3V
1 series · 1 of 1 positions shown · non-contrast
Comparison: MRI [DATE]

CLINICAL DATA: L3-4 far lateral discectomy

LUMBAR SPINE - 2-3 VIEW

[view not recorded]
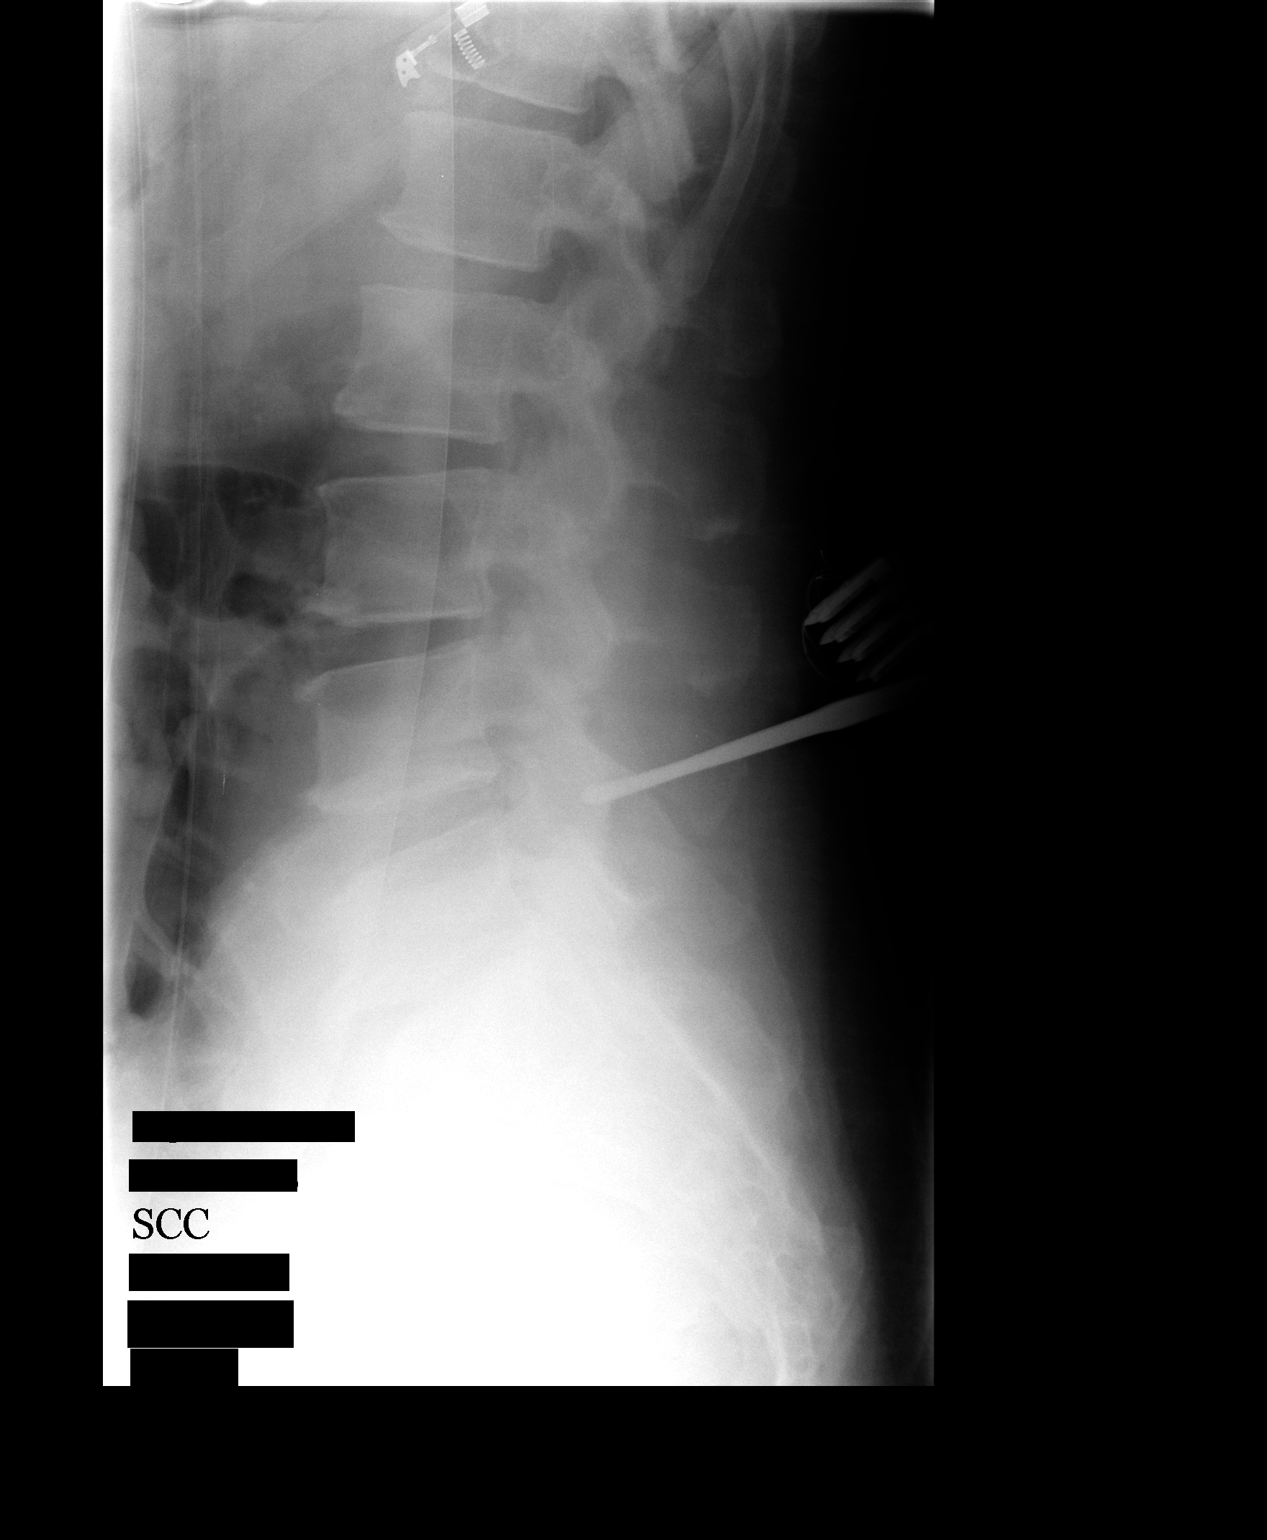

[1 of 1 positions shown; findings below may reference images not displayed]

FINDINGS: Initial film shows a needle at the spinous process of L4.
Second film shows a probe overlying the posterior elements
posterior to the L4-5 level.
IMPRESSION: L4-5 localized

## 2013-08-09 ENCOUNTER — Encounter (HOSPITAL_COMMUNITY): Payer: Self-pay | Admitting: Emergency Medicine

## 2013-08-09 ENCOUNTER — Emergency Department (HOSPITAL_COMMUNITY)
Admission: EM | Admit: 2013-08-09 | Discharge: 2013-08-09 | Disposition: A | Discharge status: Home / Self Care | Payer: Medicare Other | Attending: Emergency Medicine | Admitting: Emergency Medicine

## 2013-08-09 ENCOUNTER — Emergency Department (HOSPITAL_COMMUNITY): Payer: Medicare Other

## 2013-08-09 DIAGNOSIS — Y9389 Activity, other specified: Secondary | ICD-10-CM | POA: Insufficient documentation

## 2013-08-09 DIAGNOSIS — W2209XA Striking against other stationary object, initial encounter: Secondary | ICD-10-CM | POA: Insufficient documentation

## 2013-08-09 DIAGNOSIS — Y9289 Other specified places as the place of occurrence of the external cause: Secondary | ICD-10-CM | POA: Insufficient documentation

## 2013-08-09 DIAGNOSIS — I10 Essential (primary) hypertension: Secondary | ICD-10-CM | POA: Insufficient documentation

## 2013-08-09 DIAGNOSIS — Z79899 Other long term (current) drug therapy: Secondary | ICD-10-CM | POA: Insufficient documentation

## 2013-08-09 DIAGNOSIS — S90129A Contusion of unspecified lesser toe(s) without damage to nail, initial encounter: Secondary | ICD-10-CM | POA: Insufficient documentation

## 2013-08-09 DIAGNOSIS — S90121A Contusion of right lesser toe(s) without damage to nail, initial encounter: Secondary | ICD-10-CM

## 2013-08-09 DIAGNOSIS — F17200 Nicotine dependence, unspecified, uncomplicated: Secondary | ICD-10-CM | POA: Insufficient documentation

## 2013-08-09 MED ORDER — HYDROCODONE-ACETAMINOPHEN 5-325 MG PO TABS: 1.0000 | ORAL_TABLET | ORAL | Status: DC | PRN

## 2013-08-09 MED ORDER — HYDROCODONE-ACETAMINOPHEN 5-325 MG PO TABS: 1.0000 | ORAL_TABLET | Freq: Once | ORAL | Status: DC

## 2013-08-09 MED ORDER — HYDROCODONE-ACETAMINOPHEN 5-325 MG PO TABS
1.0000 | ORAL_TABLET | Freq: Once | ORAL | Status: AC
  Administered 2013-08-09: 1 via ORAL
  Filled 2013-08-09: qty 1

## 2013-08-09 NOTE — ED Provider Notes (Signed)
Medical screening examination/treatment/procedure(s) were performed by non-physician practitioner and as supervising physician I was immediately available for consultation/collaboration.  EKG Interpretation   None      '  Tylasia Fletchall L Kamrynn Melott, MD 08/09/13 1434 

## 2013-08-09 NOTE — ED Notes (Signed)
Pt states he woke up at 0100 to use restroom and hit right 5th toe on door frame. States unable to walk due to the pain.

## 2013-08-09 NOTE — ED Provider Notes (Signed)
CSN: [id number]     Arrival date & time 08/09/13  5621 History   First MD Initiated Contact with Patient 08/09/13 [phone]     Chief Complaint  Patient presents with  . Toe Injury   (Consider location/radiation/quality/duration/timing/severity/associated sxs/prior Treatment) HPI Comments: [name] is a 52 y.o. Male arriving by EMS with pain in his right 5th toe and lateral foot after stubbing it quite hard against a door frame at 1 am.  He has persistent pain which is constant but worsened with attempts to weight bear.  He has taken no medicines prior to arrival,  Has not applied ice or heat to the injury site.  He denies numbness in the distal toe and has no radiation of pain.  He denies other injury.     The history is provided by the patient.    Past Medical History  Diagnosis Date  . Hypertension    Past Surgical History  Procedure Laterality Date  . Knee surgery    . Back surgery    . Brain surgery     No family history on file. History  Substance Use Topics  . Smoking status: Current Every Day Smoker  . Smokeless tobacco: Not on file  . Alcohol Use: Yes     Comment: occasional    Review of Systems  Constitutional: Negative for fever.  Musculoskeletal: Positive for arthralgias and joint swelling. Negative for myalgias.  Neurological: Negative for weakness and numbness.    Allergies  Review of patient's allergies indicates no known allergies.  Home Medications   Current Outpatient Rx  Name  Route  Sig  Dispense  Refill  . lisinopril (PRINIVIL,ZESTRIL) 20 MG tablet   Oral   Take 20 mg by mouth daily.         Marland Kitchen HYDROcodone-acetaminophen (NORCO/VICODIN) 5-325 MG per tablet   Oral   Take 1 tablet by mouth once.   10 tablet   0    BP 193/97  Pulse 63  Temp(Src) 98 F (36.7 C) (Oral)  Resp 16  Ht 5' 6.5" (1.689 m)  Wt 207 lb (93.895 kg)  BMI 32.91 kg/m2  SpO2 99% Physical Exam  Constitutional: He appears well-developed and well-nourished.   HENT:  Head: Atraumatic.  Neck: Normal range of motion.  Cardiovascular:  Pulses equal bilaterally  Musculoskeletal: He exhibits tenderness.       Right foot: He exhibits bony tenderness, swelling and deformity. He exhibits normal range of motion and normal capillary refill.  Right 5th toe is held in inversion,  Slightly tucked under the 4th toe.  Tender along 5th metatarsal.  Skin intact.  Pt has less than 3 sec cap refill.  Significant onychomycosis of nails of this foot.  Neurological: He is alert. He has normal strength. He displays normal reflexes. No sensory deficit.  Equal strength  Skin: Skin is warm and dry.  Psychiatric: He has a normal mood and affect.    ED Course  Procedures (including critical care time) Labs Review Labs Reviewed - No data to display Imaging Review Dg Foot Complete Right  08/09/2013   CLINICAL DATA:  Right foot pain at 5th digit  EXAM: RIGHT FOOT COMPLETE - 3+ VIEW  COMPARISON:  09/24/2005  FINDINGS: No fracture or dislocation is seen.  Mild degenerative changes at the 1st MTP and IP joints.  The visualized soft tissues are unremarkable.  IMPRESSION: No fracture or dislocation is seen.   Electronically Signed   By: Roselie Awkward.D.  On: 08/09/2013 09:19    EKG Interpretation   None       MDM   1. Toe contusion, right, initial encounter    Patients labs and/or radiological studies were viewed and considered during the medical decision making and disposition process. No fracture,  Post op shoe provided.  Rest,  Ice,  Elevation,  Hydrocodone.  F/u with pcp in 1 week if not improved.    Burgess Amor, PA-C 08/09/13 [phone]

## 2014-07-21 ENCOUNTER — Emergency Department (HOSPITAL_COMMUNITY)
Admission: EM | Admit: 2014-07-21 | Discharge: 2014-07-21 | Disposition: A | Discharge status: Home / Self Care | Payer: Medicare Other | Attending: Emergency Medicine | Admitting: Emergency Medicine

## 2014-07-21 ENCOUNTER — Emergency Department (HOSPITAL_COMMUNITY): Payer: Medicare Other

## 2014-07-21 ENCOUNTER — Encounter (HOSPITAL_COMMUNITY): Payer: Self-pay | Admitting: Emergency Medicine

## 2014-07-21 DIAGNOSIS — I10 Essential (primary) hypertension: Secondary | ICD-10-CM | POA: Insufficient documentation

## 2014-07-21 DIAGNOSIS — Z9889 Other specified postprocedural states: Secondary | ICD-10-CM | POA: Insufficient documentation

## 2014-07-21 DIAGNOSIS — W230XXA Caught, crushed, jammed, or pinched between moving objects, initial encounter: Secondary | ICD-10-CM | POA: Diagnosis not present

## 2014-07-21 DIAGNOSIS — Y9389 Activity, other specified: Secondary | ICD-10-CM | POA: Diagnosis not present

## 2014-07-21 DIAGNOSIS — Z79899 Other long term (current) drug therapy: Secondary | ICD-10-CM | POA: Diagnosis not present

## 2014-07-21 DIAGNOSIS — S8991XA Unspecified injury of right lower leg, initial encounter: Secondary | ICD-10-CM | POA: Diagnosis not present

## 2014-07-21 DIAGNOSIS — Y9289 Other specified places as the place of occurrence of the external cause: Secondary | ICD-10-CM | POA: Insufficient documentation

## 2014-07-21 DIAGNOSIS — M79606 Pain in leg, unspecified: Secondary | ICD-10-CM

## 2014-07-21 MED ORDER — OXYCODONE-ACETAMINOPHEN 5-325 MG PO TABS
1.0000 | ORAL_TABLET | Freq: Once | ORAL | Status: AC
  Administered 2014-07-21: 1 via ORAL
  Filled 2014-07-21: qty 1

## 2014-07-21 MED ORDER — INDOMETHACIN 25 MG PO CAPS: 25.0000 mg | ORAL_CAPSULE | Freq: Once | ORAL | Status: DC

## 2014-07-21 MED ORDER — PREDNISONE 50 MG PO TABS: 60.0000 mg | ORAL_TABLET | Freq: Once | ORAL | Status: DC

## 2014-07-21 NOTE — ED Provider Notes (Signed)
Medical screening examination/treatment/procedure(s) were performed by non-physician practitioner and as supervising physician I was immediately available for consultation/collaboration.   EKG Interpretation None        Braxon Suder L Debera Sterba, MD 07/21/14 2205 
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
## 2014-07-21 NOTE — Discharge Instructions (Signed)
YOur xrays are negative for fracture or dislocation. Your vital signs do not show high fever. Please see Dr Loleta ChanceHill or Dr Hilda LiasKeeling for additional evaluation of the lower extremity pain.

## 2014-07-21 NOTE — ED Provider Notes (Signed)
Medical screening examination/treatment/procedure(s) were performed by non-physician practitioner and as supervising physician I was immediately available for consultation/collaboration.   EKG Interpretation None        Caylyn Tedeschi L Ileane Sando, MD 07/21/14 2205 
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
## 2014-08-10 ENCOUNTER — Encounter (HOSPITAL_COMMUNITY): Payer: Self-pay | Admitting: Emergency Medicine

## 2014-08-10 ENCOUNTER — Emergency Department (HOSPITAL_COMMUNITY)
Admission: EM | Admit: 2014-08-10 | Discharge: 2014-08-10 | Disposition: A | Discharge status: Home / Self Care | Payer: Medicare Other | Attending: Emergency Medicine | Admitting: Emergency Medicine

## 2014-08-10 ENCOUNTER — Emergency Department (HOSPITAL_COMMUNITY): Payer: Medicare Other

## 2014-08-10 DIAGNOSIS — M7981 Nontraumatic hematoma of soft tissue: Secondary | ICD-10-CM | POA: Insufficient documentation

## 2014-08-10 DIAGNOSIS — I10 Essential (primary) hypertension: Secondary | ICD-10-CM | POA: Diagnosis not present

## 2014-08-10 DIAGNOSIS — Z792 Long term (current) use of antibiotics: Secondary | ICD-10-CM | POA: Diagnosis not present

## 2014-08-10 DIAGNOSIS — R52 Pain, unspecified: Secondary | ICD-10-CM

## 2014-08-10 DIAGNOSIS — Z87828 Personal history of other (healed) physical injury and trauma: Secondary | ICD-10-CM | POA: Insufficient documentation

## 2014-08-10 DIAGNOSIS — M79662 Pain in left lower leg: Secondary | ICD-10-CM | POA: Diagnosis present

## 2014-08-10 DIAGNOSIS — Z79899 Other long term (current) drug therapy: Secondary | ICD-10-CM | POA: Diagnosis not present

## 2014-08-10 DIAGNOSIS — Z87891 Personal history of nicotine dependence: Secondary | ICD-10-CM | POA: Diagnosis not present

## 2014-08-10 DIAGNOSIS — S8011XA Contusion of right lower leg, initial encounter: Secondary | ICD-10-CM

## 2014-08-10 MED ORDER — IBUPROFEN 800 MG PO TABS
800.0000 mg | ORAL_TABLET | Freq: Once | ORAL | Status: AC
  Administered 2014-08-10: 800 mg via ORAL
  Filled 2014-08-10: qty 1

## 2014-08-10 MED ORDER — ACETAMINOPHEN 500 MG PO TABS
1000.0000 mg | ORAL_TABLET | Freq: Once | ORAL | Status: AC
  Administered 2014-08-10: 1000 mg via ORAL
  Filled 2014-08-10: qty 2

## 2014-08-10 MED ORDER — OXYCODONE-ACETAMINOPHEN 5-325 MG PO TABS: 2.0000 | ORAL_TABLET | ORAL | Status: DC | PRN
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
## 2014-08-10 NOTE — Discharge Instructions (Signed)
Scan showed no worrisome findings. Elevate leg. Ace wrap. Pain medication
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
## 2015-08-17 ENCOUNTER — Other Ambulatory Visit (HOSPITAL_COMMUNITY): Payer: Self-pay | Admitting: Family Medicine

## 2015-08-17 DIAGNOSIS — R531 Weakness: Secondary | ICD-10-CM

## 2015-08-30 ENCOUNTER — Ambulatory Visit (HOSPITAL_COMMUNITY): Admission: RE | Admit: 2015-08-30 | Payer: Medicaid Other | Source: Ambulatory Visit

## 2015-09-20 ENCOUNTER — Ambulatory Visit (HOSPITAL_COMMUNITY): Payer: Medicaid Other

## 2015-09-22 ENCOUNTER — Emergency Department (HOSPITAL_COMMUNITY)
Admission: EM | Admit: 2015-09-22 | Discharge: 2015-09-22 | Disposition: A | Discharge status: Home / Self Care | Payer: Medicare Other | Attending: Emergency Medicine | Admitting: Emergency Medicine

## 2015-09-22 ENCOUNTER — Emergency Department (HOSPITAL_COMMUNITY): Payer: Medicare Other

## 2015-09-22 ENCOUNTER — Encounter (HOSPITAL_COMMUNITY): Payer: Self-pay | Admitting: Emergency Medicine

## 2015-09-22 DIAGNOSIS — J159 Unspecified bacterial pneumonia: Secondary | ICD-10-CM | POA: Insufficient documentation

## 2015-09-22 DIAGNOSIS — J189 Pneumonia, unspecified organism: Secondary | ICD-10-CM

## 2015-09-22 DIAGNOSIS — Z87891 Personal history of nicotine dependence: Secondary | ICD-10-CM | POA: Diagnosis not present

## 2015-09-22 DIAGNOSIS — I10 Essential (primary) hypertension: Secondary | ICD-10-CM | POA: Diagnosis not present

## 2015-09-22 DIAGNOSIS — Z79899 Other long term (current) drug therapy: Secondary | ICD-10-CM | POA: Diagnosis not present

## 2015-09-22 DIAGNOSIS — R05 Cough: Secondary | ICD-10-CM | POA: Diagnosis present

## 2015-09-22 DIAGNOSIS — R197 Diarrhea, unspecified: Secondary | ICD-10-CM | POA: Insufficient documentation

## 2015-09-22 DIAGNOSIS — Z792 Long term (current) use of antibiotics: Secondary | ICD-10-CM | POA: Diagnosis not present

## 2015-09-22 MED ORDER — BENZONATATE 100 MG PO CAPS: 200.0000 mg | ORAL_CAPSULE | Freq: Three times a day (TID) | ORAL | Status: DC | PRN

## 2015-09-22 MED ORDER — LEVOFLOXACIN 500 MG PO TABS: 500.0000 mg | ORAL_TABLET | Freq: Every day | ORAL | Status: DC

## 2015-09-22 MED ORDER — LEVOFLOXACIN 500 MG PO TABS
500.0000 mg | ORAL_TABLET | Freq: Once | ORAL | Status: AC
  Administered 2015-09-22: 500 mg via ORAL
  Filled 2015-09-22: qty 1

## 2015-09-22 MED ORDER — ALBUTEROL SULFATE HFA 108 (90 BASE) MCG/ACT IN AERS
2.0000 | INHALATION_SPRAY | Freq: Once | RESPIRATORY_TRACT | Status: AC
  Administered 2015-09-22: 2 via RESPIRATORY_TRACT
  Filled 2015-09-22: qty 6.7

## 2015-09-22 MED ORDER — HYDROCOD POLST-CPM POLST ER 10-8 MG/5ML PO SUER
5.0000 mL | Freq: Once | ORAL | Status: AC
  Administered 2015-09-22: 5 mL via ORAL
  Filled 2015-09-22: qty 5

## 2015-09-22 MED ORDER — HYDROCODONE-ACETAMINOPHEN 5-325 MG PO TABS: ORAL_TABLET | ORAL | Status: DC

## 2015-09-22 NOTE — ED Provider Notes (Signed)
CSN: [id number]     Arrival date & time 09/22/15  1003 History   First MD Initiated Contact with Patient 09/22/15 1022     Chief Complaint  Patient presents with  . Generalized Body Aches     (Consider location/radiation/quality/duration/timing/severity/associated sxs/prior Treatment) HPI   [name] is a 54 y.o. male who presents to the Emergency Department complaining of generalized body aches, cough, chills, nasal congestion and general malaise for 2 days. He also reports 3 episodes of watery diarrhea yesterday. Cough is productive of colored mucus per patient.  He states that he was recently around his granddaughter who was diagnosed with the flu. Patient did not take a flu shot this year he states that he has taken 1 dose of over-the-counter cold medication this morning. Blood pressure is elevated today and he states that "my blood pressure normally runs high and I did not take my medication this morning" he denies chest pain, fever, abdominal pain, vomiting, shortness of breath, neck pain or stiffness and visual changes   Past Medical History  Diagnosis Date  . Hypertension    Past Surgical History  Procedure Laterality Date  . Knee surgery    . Back surgery    . Brain surgery     No family history on file. Social History  Substance Use Topics  . Smoking status: Former Games developermoker  . Smokeless tobacco: None  . Alcohol Use: Yes     Comment: occasional    Review of Systems  Constitutional: Positive for chills, activity change and appetite change. Negative for fever.  HENT: Positive for congestion, rhinorrhea and sore throat. Negative for facial swelling and trouble swallowing.   Eyes: Negative for visual disturbance.  Respiratory: Positive for cough. Negative for chest tightness, shortness of breath, wheezing and stridor.   Cardiovascular: Negative for chest pain.  Gastrointestinal: Positive for diarrhea. Negative for nausea and vomiting.  Musculoskeletal: Positive for  myalgias. Negative for neck pain and neck stiffness.  Skin: Negative.  Negative for rash.  Neurological: Negative for dizziness, weakness, numbness and headaches.  Hematological: Negative for adenopathy.  Psychiatric/Behavioral: Negative for confusion.  All other systems reviewed and are negative.     Allergies  Review of patient's allergies indicates no known allergies.  Home Medications   Prior to Admission medications   Medication Sig Start Date End Date Taking? Authorizing Provider  cephALEXin (KEFLEX) 500 MG capsule Take 1 capsule by mouth 4 (four) times daily. 07/21/14   Historical Provider, MD  CIALIS 5 MG tablet Take 1 tablet by mouth daily as needed for erectile dysfunction.  07/21/14   Historical Provider, MD  lisinopril (PRINIVIL,ZESTRIL) 20 MG tablet Take 20 mg by mouth daily.    Historical Provider, MD  oxyCODONE-acetaminophen (PERCOCET) 10-325 MG per tablet Take 1 tablet by mouth 5 (five) times daily. 07/21/14   Historical Provider, MD  oxyCODONE-acetaminophen (PERCOCET) 5-325 MG per tablet Take 2 tablets by mouth every 4 (four) hours as needed. 08/10/14   Donnetta HutchingBrian Cook, MD   BP 198/117 mmHg  Pulse 67  Temp(Src) 97.7 F (36.5 C) (Oral)  Resp 18  Ht 5' 6.5" (1.689 m)  Wt 91.173 kg  BMI 31.96 kg/m2  SpO2 98% Physical Exam  Constitutional: He is oriented to person, place, and time. He appears well-developed and well-nourished. No distress.  HENT:  Head: Normocephalic and atraumatic.  Right Ear: Tympanic membrane and ear canal normal.  Left Ear: Tympanic membrane and ear canal normal.  Nose: Mucosal edema and rhinorrhea present.  Mouth/Throat: Uvula is midline and mucous membranes are normal. No trismus in the jaw. No uvula swelling. Posterior oropharyngeal erythema present. No oropharyngeal exudate, posterior oropharyngeal edema or tonsillar abscesses.  Eyes: Conjunctivae are normal.  Neck: Normal range of motion and phonation normal. Neck supple. No tracheal deviation  present. No Brudzinski's sign and no Kernig's sign noted.  Cardiovascular: Normal rate, regular rhythm, normal heart sounds and intact distal pulses.   No murmur heard. Pulmonary/Chest: Effort normal. No respiratory distress. He has no wheezes. He has no rales.  Coarse lung sounds bilaterally, no wheezing or rales.  Abdominal: Soft. He exhibits no distension. There is no tenderness. There is no rebound and no guarding.  Musculoskeletal: Normal range of motion. He exhibits no edema.  Lymphadenopathy:    He has no cervical adenopathy.  Neurological: He is alert and oriented to person, place, and time. He exhibits normal muscle tone. Coordination normal.  Skin: Skin is warm and dry.  Psychiatric: He has a normal mood and affect.  Nursing note and vitals reviewed.   ED Course  Procedures (including critical care time) Labs Review Labs Reviewed - No data to display  Imaging Review Dg Chest 2 View  09/22/2015  CLINICAL DATA:  Coughing congestion since last night. Smoking history. EXAM: CHEST  2 VIEW COMPARISON:  05/28/2012 FINDINGS: Heart size is normal but there is left ventricular prominence. The aorta is unfolded. There is bronchial thickening. No consolidation or lobar collapse. There could be mild patchy basilar infiltrate as evidenced by some increased density in the lower lungs on the lateral view. No effusions. No bony findings. IMPRESSION: Left ventricular prominence and unfolded aorta. Is there a history of hypertension? Bronchial thickening consistent with bronchitis. Question patchy density at the lung bases on the lateral view. This could represent mild patchy pneumonia. Certainly there is no advanced consolidation or collapse. Electronically Signed   By: Paulina Fusi M.D.   On: 09/22/2015 12:07   I have personally reviewed and evaluated these images and lab results as part of my medical decision-making.    MDM   Final diagnoses:  Community acquired pneumonia    Pt is feeling  better. Non-toxic appearing.  No tachycardia, tachypnea or hypoxia. Hypertensive, but reports that he did not take his BP medication this morning and his BP frequently runs high.  Appears stable for d/c.  Strict return precautions given and he verb understanding and agrees to return if not improving.  Rx for levaquin and tessalon, vicodin #8, albuterol MDI dispensed.      Pauline Aus, PA-C 09/23/15 [id number]  Glynn Octave, MD 09/23/15 [phone]

## 2015-09-22 NOTE — Discharge Instructions (Signed)

## 2015-09-22 NOTE — ED Notes (Signed)
RT called

## 2015-09-22 NOTE — ED Notes (Signed)
EDPa informed of pt's BP. Pt reports he has not taken his BP meds today, has hx of high readings.

## 2015-09-22 NOTE — ED Notes (Signed)
Pt states, "I think I have the flu." Pt states his granddaughter was recently diagnosed with the flu. Pt reports generalized body aches, HA, cough, nasal congestion, and 3 episodes of diarrhea x 1 day.

## 2015-10-19 ENCOUNTER — Ambulatory Visit (HOSPITAL_COMMUNITY): Payer: Medicaid Other

## 2016-07-01 ENCOUNTER — Ambulatory Visit: Payer: Self-pay | Admitting: Surgery

## 2016-07-02 ENCOUNTER — Ambulatory Visit: Payer: Self-pay | Admitting: Surgery

## 2016-07-04 ENCOUNTER — Inpatient Hospital Stay
Admission: RE | Admit: 2016-07-04 | Discharge: 2016-07-04 | Disposition: A | Discharge status: Home / Self Care | Payer: Self-pay | Source: Ambulatory Visit | Attending: Surgery | Admitting: Surgery

## 2016-07-04 ENCOUNTER — Other Ambulatory Visit (HOSPITAL_COMMUNITY): Payer: Self-pay | Admitting: Surgery

## 2016-07-04 DIAGNOSIS — E279 Disorder of adrenal gland, unspecified: Secondary | ICD-10-CM

## 2016-08-22 ENCOUNTER — Ambulatory Visit: Payer: Medicare Other | Admitting: Internal Medicine

## 2016-09-13 ENCOUNTER — Ambulatory Visit (INDEPENDENT_AMBULATORY_CARE_PROVIDER_SITE_OTHER): Payer: Medicare Other | Admitting: Internal Medicine

## 2016-09-13 ENCOUNTER — Encounter: Payer: Self-pay | Admitting: Internal Medicine

## 2016-09-13 DIAGNOSIS — E279 Disorder of adrenal gland, unspecified: Secondary | ICD-10-CM | POA: Diagnosis not present

## 2016-09-13 DIAGNOSIS — E278 Other specified disorders of adrenal gland: Secondary | ICD-10-CM

## 2016-09-13 LAB — POTASSIUM: POTASSIUM: 3.6 meq/L (ref 3.5–5.1)

## 2016-09-13 MED ORDER — DEXAMETHASONE 1 MG PO TABS: ORAL_TABLET | ORAL | 0 refills | Status: DC
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
## 2016-09-13 NOTE — Patient Instructions (Addendum)
Please stop at the lab.  Take the Dexamethasone tablet at 11 pm before going for labs at 8 am the next morning.  Please return in 1 year.  Dexamethasone Suppression Test Why am I having this test? This test helps your health care provider to learn if your adrenal gland is overactive. If it is overactive, this test can also help your health care provider to determine why it is overactive. This test can be used to help diagnose:  Cushing syndrome.  Cushing disease.  Tumors of the adrenal gland.  Mental depression. This test looks at how your pituitary and adrenal glands are working together. It does this by measuring your body's response to a medicine. There are two types of dexamethasone suppression tests: the prolonged test and the rapid test. What kind of sample is taken? The sample collected will depend on the kind of test that your health care provider chooses to perform:  A prolonged test requires the collection of urine samples over a 24-hour period.  The rapid test requires a blood sample. It is usually collected by inserting a needle into a vein. Your health care provider will give you the instructions and supplies that you need for the test. How do I prepare for this test?  Follow your health care provider's instructions. Your health care provider will provide you with instructions specific to the kind of test that will be done.  Take medicine only as directed by your health care provider. For both the prolonged and rapid tests, you will need to take a medicine in doses and intervals that are determined by your health care provider. What are the reference ranges? Reference ranges are considered healthy ranges established after testing a large group of healthy people. Reference ranges may vary among different people, labs, and hospitals. It is your responsibility to obtain your test results. Ask the lab or department performing the test when and how you will get your  results. Prolonged Test Reference Ranges  Low dose: greater than 50% reduction of plasma cortisol and 17-hydroxycorticosteroid (17-OCHS) levels.  High dose: greater than 50% reduction of plasma cortisol and 17-OCHS levels. Rapid Test Reference Ranges  Nearly 0 (zero) cortisol levels. What do the results mean? Values outside of the reference ranges may mean that your adrenal gland is overactive and that you have:  Cushing disease.  Certain tumors that affect the adrenal gland.  Disorders of the adrenal gland.  Mental depression. Talk with your health care provider to discuss your results, treatment options, and if necessary, the need for more tests. Talk with your health care provider if you have any questions about your results. Talk with your health care provider to discuss your results, treatment options, and if necessary, the need for more tests. Talk with your health care provider if you have any questions about your results. This information is not intended to replace advice given to you by your health care provider. Make sure you discuss any questions you have with your health care provider. Document Released: 11/01/2004 Document Revised: 06/04/2016 Document Reviewed: 02/24/2014 Elsevier Interactive Patient Education  2017 [employer]

## 2016-09-16 ENCOUNTER — Other Ambulatory Visit: Payer: Self-pay | Admitting: Internal Medicine

## 2016-09-16 ENCOUNTER — Other Ambulatory Visit: Payer: Medicare Other

## 2016-09-16 DIAGNOSIS — E278 Other specified disorders of adrenal gland: Secondary | ICD-10-CM

## 2016-09-17 LAB — METANEPHRINES, PLASMA
METANEPHRINE FREE: 27 pg/mL (ref <=57)
Normetanephrine, Free: 78 pg/mL (ref <=148)
Total Metanephrines-Plasma: 105 pg/mL (ref <=205)

## 2016-09-17 LAB — CORTISOL-AM, BLOOD: CORTISOL - AM: 1.4 ug/dL — AB

## 2016-09-18 ENCOUNTER — Ambulatory Visit: Payer: Medicare Other | Admitting: Internal Medicine

## 2016-09-19 LAB — CATECHOLAMINES, FRACTIONATED, PLASMA
Catecholamines, Total: 375 pg/mL
Epinephrine: 29 pg/mL
Norepinephrine: 346 pg/mL

## 2016-09-19 LAB — ALDOSTERONE + RENIN ACTIVITY W/ RATIO
ALDO / PRA RATIO: 27.3 ratio (ref 0.9–28.9)
Aldosterone: 6 ng/dL
PRA LC/MS/MS: 0.22 ng/mL/h — AB (ref 0.25–5.82)

## 2016-09-24 ENCOUNTER — Other Ambulatory Visit: Payer: Self-pay

## 2016-09-24 LAB — DEXAMETHASONE, BLOOD: Dexamethasone, Serum: 320 ng/dL

## 2016-09-25 ENCOUNTER — Telehealth: Payer: Self-pay

## 2016-09-25 NOTE — Telephone Encounter (Signed)
Patient returned call. Gave lab results and instructions. Patient verbalized understanding.

## 2016-09-25 NOTE — Telephone Encounter (Signed)
Called patient. No answer. Will try later.  

## 2016-09-25 NOTE — Telephone Encounter (Signed)
-----   Message from [name], MD sent at 09/24/2016  6:04 PM EST ----- Kenneth FanningJulie, can you please call pt: All his adrenal labs have returned normal. This includes the cortisol level after taking dexamethasone, which was 1.4 (normal <5). This has not been resulted into Epic yet. Will scan the faxed result. His adrenal nodule does not appear to be oversecreting hormones, so no further intervention is needed at this time, but we will repeat the labs when he comes back in a year.

## 2017-09-17 ENCOUNTER — Ambulatory Visit: Payer: Medicare Other | Admitting: Internal Medicine

## 2018-06-08 ENCOUNTER — Other Ambulatory Visit: Payer: Self-pay

## 2018-06-08 ENCOUNTER — Emergency Department (HOSPITAL_COMMUNITY): Payer: Medicare Other

## 2018-06-08 ENCOUNTER — Encounter (HOSPITAL_COMMUNITY): Payer: Self-pay

## 2018-06-08 ENCOUNTER — Emergency Department (HOSPITAL_COMMUNITY)
Admission: EM | Admit: 2018-06-08 | Discharge: 2018-06-08 | Disposition: A | Discharge status: Home / Self Care | Payer: Medicare Other | Attending: Emergency Medicine | Admitting: Emergency Medicine

## 2018-06-08 DIAGNOSIS — I10 Essential (primary) hypertension: Secondary | ICD-10-CM | POA: Insufficient documentation

## 2018-06-08 DIAGNOSIS — R42 Dizziness and giddiness: Secondary | ICD-10-CM | POA: Diagnosis not present

## 2018-06-08 DIAGNOSIS — Y929 Unspecified place or not applicable: Secondary | ICD-10-CM | POA: Diagnosis not present

## 2018-06-08 DIAGNOSIS — Z79899 Other long term (current) drug therapy: Secondary | ICD-10-CM | POA: Diagnosis not present

## 2018-06-08 DIAGNOSIS — Z87891 Personal history of nicotine dependence: Secondary | ICD-10-CM | POA: Insufficient documentation

## 2018-06-08 DIAGNOSIS — Y999 Unspecified external cause status: Secondary | ICD-10-CM | POA: Insufficient documentation

## 2018-06-08 DIAGNOSIS — Y9389 Activity, other specified: Secondary | ICD-10-CM | POA: Insufficient documentation

## 2018-06-08 DIAGNOSIS — W01198A Fall on same level from slipping, tripping and stumbling with subsequent striking against other object, initial encounter: Secondary | ICD-10-CM | POA: Insufficient documentation

## 2018-06-08 DIAGNOSIS — S0990XA Unspecified injury of head, initial encounter: Secondary | ICD-10-CM | POA: Diagnosis not present

## 2018-06-08 MED ORDER — MECLIZINE HCL 12.5 MG PO TABS
25.0000 mg | ORAL_TABLET | Freq: Once | ORAL | Status: AC
  Administered 2018-06-08: 25 mg via ORAL
  Filled 2018-06-08: qty 2

## 2018-06-08 MED ORDER — MECLIZINE HCL 25 MG PO TABS: 25.0000 mg | ORAL_TABLET | Freq: Three times a day (TID) | ORAL | 0 refills | Status: DC | PRN
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
## 2018-06-08 NOTE — Discharge Instructions (Addendum)
Please make sure to take your medication exactly as it is prescribed.  Please monitor your blood pressure at home. Take your blood pressure once a day, and keep a record of the readings -take that record with you when you see your doctor.

## 2018-06-08 NOTE — ED Triage Notes (Addendum)
[place], tripped and fell backwards, hitting head. No LOC. Pt reports he has had dizzy spells since then when turning his head, spells last short time, but have continued. Pt reports having high BP and did not take his medication today, also reports he does not take 3 times a day as directed, only been taking once a day. Pt drove self here tonight.

## 2019-07-02 ENCOUNTER — Other Ambulatory Visit: Payer: Self-pay | Admitting: *Deleted

## 2019-07-02 DIAGNOSIS — Z20822 Contact with and (suspected) exposure to covid-19: Secondary | ICD-10-CM

## 2019-07-03 LAB — NOVEL CORONAVIRUS, NAA: SARS-CoV-2, NAA: NOT DETECTED

## 2020-12-22 DIAGNOSIS — M5459 Other low back pain: Secondary | ICD-10-CM | POA: Diagnosis not present

## 2020-12-22 DIAGNOSIS — E7849 Other hyperlipidemia: Secondary | ICD-10-CM | POA: Diagnosis not present

## 2020-12-22 DIAGNOSIS — K219 Gastro-esophageal reflux disease without esophagitis: Secondary | ICD-10-CM | POA: Diagnosis not present

## 2020-12-22 DIAGNOSIS — G8929 Other chronic pain: Secondary | ICD-10-CM | POA: Diagnosis not present

## 2021-01-22 DIAGNOSIS — K219 Gastro-esophageal reflux disease without esophagitis: Secondary | ICD-10-CM | POA: Diagnosis not present

## 2021-01-22 DIAGNOSIS — G8929 Other chronic pain: Secondary | ICD-10-CM | POA: Diagnosis not present

## 2021-01-22 DIAGNOSIS — M5459 Other low back pain: Secondary | ICD-10-CM | POA: Diagnosis not present

## 2021-02-14 ENCOUNTER — Encounter: Payer: Self-pay | Admitting: Internal Medicine

## 2021-04-06 DIAGNOSIS — I110 Hypertensive heart disease with heart failure: Secondary | ICD-10-CM | POA: Diagnosis not present

## 2021-04-06 DIAGNOSIS — R5383 Other fatigue: Secondary | ICD-10-CM | POA: Diagnosis not present

## 2021-04-06 DIAGNOSIS — I119 Hypertensive heart disease without heart failure: Secondary | ICD-10-CM | POA: Diagnosis not present

## 2021-04-06 DIAGNOSIS — K219 Gastro-esophageal reflux disease without esophagitis: Secondary | ICD-10-CM | POA: Diagnosis not present

## 2021-04-06 DIAGNOSIS — E7849 Other hyperlipidemia: Secondary | ICD-10-CM | POA: Diagnosis not present

## 2021-04-06 DIAGNOSIS — D510 Vitamin B12 deficiency anemia due to intrinsic factor deficiency: Secondary | ICD-10-CM | POA: Diagnosis not present

## 2021-04-06 DIAGNOSIS — E559 Vitamin D deficiency, unspecified: Secondary | ICD-10-CM | POA: Diagnosis not present

## 2021-04-12 ENCOUNTER — Telehealth: Payer: Self-pay

## 2021-04-12 ENCOUNTER — Other Ambulatory Visit: Payer: Self-pay

## 2021-04-12 ENCOUNTER — Ambulatory Visit (INDEPENDENT_AMBULATORY_CARE_PROVIDER_SITE_OTHER): Payer: Medicare Other | Admitting: Internal Medicine

## 2021-04-12 ENCOUNTER — Encounter: Payer: Self-pay | Admitting: Internal Medicine

## 2021-04-12 VITALS — BP 194/93 | HR 78 | Temp 96.8°F | Ht 67.0 in | Wt 243.8 lb

## 2021-04-12 DIAGNOSIS — Z79899 Other long term (current) drug therapy: Secondary | ICD-10-CM

## 2021-04-12 DIAGNOSIS — Z1211 Encounter for screening for malignant neoplasm of colon: Secondary | ICD-10-CM

## 2021-04-12 MED ORDER — CLENPIQ 10-3.5-12 MG-GM -GM/160ML PO SOLN: 1.0000 | Freq: Once | ORAL | 0 refills | Status: AC
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
## 2021-04-12 NOTE — Patient Instructions (Signed)
We will schedule you for colonoscopy for colon cancer screening purposes.  Further recommendations to follow.  At [facility] we value your feedback. You may receive a survey about your visit today. Please share your experience as we strive to create trusting relationships with our patients to provide genuine, compassionate, quality care.  We appreciate your understanding and patience as we review any laboratory studies, imaging, and other diagnostic tests that are ordered as we care for you. Our office policy is 5 business days for review of these results, and any emergent or urgent results are addressed in a timely manner for your best interest. If you do not hear from our office in 1 week, please contact us.   We also encourage the use of MyChart, which contains your medical information for your review as well. If you are not enrolled in this feature, an access code is on this after visit summary for your convenience. Thank you for allowing [place] to be involved in your care.  It was great to see you today!  I hope you have a great rest of your summer!!    Hennie Duos. Marletta Lor, D.O. Gastroenterology and Hepatology [facility]

## 2021-04-12 NOTE — Telephone Encounter (Signed)
PA for TCS submitted via [facility] website. PA# [id number], valid 05/21/21-08/19/21.

## 2021-05-14 NOTE — Patient Instructions (Signed)
[name]  05/14/2021     @PREFPERIOPPHARMACY @   Your procedure is scheduled on  05/21/2021.   Report to [facility] at  1000  A.M.   Call this number if you have problems the morning of surgery:  [phone]   Remember:  Follow the diet and prep instructions given to you by the office.    Take these medicines the morning of surgery with A SIP OF WATER        antivert(if needed), mobic or oxycodone(if needed).     Do not wear jewelry, make-up or nail polish.  Do not wear lotions, powders, or perfumes, or deodorant.  Do not shave 48 hours prior to surgery.  Men may shave face and neck.  Do not bring valuables to the hospital.  [facility] is not responsible for any belongings or valuables.  Contacts, dentures or bridgework may not be worn into surgery.  Leave your suitcase in the car.  After surgery it may be brought to your room.  For patients admitted to the hospital, discharge time will be determined by your treatment team.  Patients discharged the day of surgery will not be allowed to drive home and must have someone with them for 24 hours.    Special instructions:   DO NOT smoke tobacco or vape for 24 hours before your procedure.  Please read over the following fact sheets that you were given. Anesthesia Post-op Instructions and Care and Recovery After Surgery      Colonoscopy, Adult, Care After This sheet gives you information about how to care for yourself after your procedure. Your health care provider may also give you more specific instructions. If you have problems or questions, contact your health careprovider. What can I expect after the procedure? After the procedure, it is common to have: A small amount of blood in your stool for 24 hours after the procedure. Some gas. Mild cramping or bloating of your abdomen. Follow these instructions at home: Eating and drinking  Drink enough fluid to keep your urine pale yellow. Follow instructions  from your health care provider about eating or drinking restrictions. Resume your normal diet as instructed by your health care provider. Avoid heavy or fried foods that are hard to digest.  Activity Rest as told by your health care provider. Avoid sitting for a long time without moving. Get up to take short walks every 1-2 hours. This is important to improve blood flow and breathing. Ask for help if you feel weak or unsteady. Return to your normal activities as told by your health care provider. Ask your health care provider what activities are safe for you. Managing cramping and bloating  Try walking around when you have cramps or feel bloated. Apply heat to your abdomen as told by your health care provider. Use the heat source that your health care provider recommends, such as a moist heat pack or a heating pad. Place a towel between your skin and the heat source. Leave the heat on for 20-30 minutes. Remove the heat if your skin turns bright red. This is especially important if you are unable to feel pain, heat, or cold. You may have a greater risk of getting burned.  General instructions If you were given a sedative during the procedure, it can affect you for several hours. Do not drive or operate machinery until your health care provider says that it is safe. For the first 24 hours after the procedure:  Do not sign important documents. Do not drink alcohol. Do your regular daily activities at a slower pace than normal. Eat soft foods that are easy to digest. Take over-the-counter and prescription medicines only as told by your health care provider. Keep all follow-up visits as told by your health care provider. This is important. Contact a health care provider if: You have blood in your stool 2-3 days after the procedure. Get help right away if you have: More than a small spotting of blood in your stool. Large blood clots in your stool. Swelling of your abdomen. Nausea or  vomiting. A fever. Increasing pain in your abdomen that is not relieved with medicine. Summary After the procedure, it is common to have a small amount of blood in your stool. You may also have mild cramping and bloating of your abdomen. If you were given a sedative during the procedure, it can affect you for several hours. Do not drive or operate machinery until your health care provider says that it is safe. Get help right away if you have a lot of blood in your stool, nausea or vomiting, a fever, or increased pain in your abdomen. This information is not intended to replace advice given to you by your health care provider. Make sure you discuss any questions you have with your healthcare provider. Document Revised: 09/24/2019 Document Reviewed: 04/26/2019 Elsevier Patient Education  2022 [employer] Monitored Anesthesia Care, Care After This sheet gives you information about how to care for yourself after your procedure. Your health care provider may also give you more specific instructions. If you have problems or questions, contact your health careprovider. What can I expect after the procedure? After the procedure, it is common to have: Tiredness. Forgetfulness about what happened after the procedure. Impaired judgment for important decisions. Nausea or vomiting. Some difficulty with balance. Follow these instructions at home: For the time period you were told by your health care provider:     Rest as needed. Do not participate in activities where you could fall or become injured. Do not drive or use machinery. Do not drink alcohol. Do not take sleeping pills or medicines that cause drowsiness. Do not make important decisions or sign legal documents. Do not take care of children on your own. Eating and drinking Follow the diet that is recommended by your health care provider. Drink enough fluid to keep your urine pale yellow. If you vomit: Drink water, juice, or soup when  you can drink without vomiting. Make sure you have little or no nausea before eating solid foods. General instructions Have a responsible adult stay with you for the time you are told. It is important to have someone help care for you until you are awake and alert. Take over-the-counter and prescription medicines only as told by your health care provider. If you have sleep apnea, surgery and certain medicines can increase your risk for breathing problems. Follow instructions from your health care provider about wearing your sleep device: Anytime you are sleeping, including during daytime naps. While taking prescription pain medicines, sleeping medicines, or medicines that make you drowsy. Avoid smoking. Keep all follow-up visits as told by your health care provider. This is important. Contact a health care provider if: You keep feeling nauseous or you keep vomiting. You feel light-headed. You are still sleepy or having trouble with balance after 24 hours. You develop a rash. You have a fever. You have redness or swelling around the IV site. Get help right away  if: You have trouble breathing. You have new-onset confusion at home. Summary For several hours after your procedure, you may feel tired. You may also be forgetful and have poor judgment. Have a responsible adult stay with you for the time you are told. It is important to have someone help care for you until you are awake and alert. Rest as told. Do not drive or operate machinery. Do not drink alcohol or take sleeping pills. Get help right away if you have trouble breathing, or if you suddenly become confused. This information is not intended to replace advice given to you by your health care provider. Make sure you discuss any questions you have with your healthcare provider. Document Revised: 06/15/2020 Document Reviewed: 09/02/2019 Elsevier Patient Education  2022 [employer].

## 2021-05-17 ENCOUNTER — Encounter (HOSPITAL_COMMUNITY): Payer: Self-pay

## 2021-05-17 ENCOUNTER — Encounter (HOSPITAL_COMMUNITY)
Admission: RE | Admit: 2021-05-17 | Discharge: 2021-05-17 | Disposition: A | Discharge status: Home / Self Care | Payer: Medicare Other | Source: Ambulatory Visit | Attending: Internal Medicine | Admitting: Internal Medicine

## 2021-05-17 ENCOUNTER — Telehealth: Payer: Self-pay | Admitting: *Deleted

## 2021-05-17 NOTE — Telephone Encounter (Signed)
-----   Message from Elsie Amis, RN sent at 05/17/2021 10:20 AM EDT ----- Regarding: no show Good morning [name]! [name] did not show for his PAT this morning.

## 2021-05-17 NOTE — Telephone Encounter (Signed)
Called pt, went straight to VM. Unable to leave VM. Endo has cancelled procedure for monday

## 2021-05-18 NOTE — Telephone Encounter (Signed)
Called pt, went straight to VM. Letter mailed

## 2021-05-21 ENCOUNTER — Telehealth: Payer: Self-pay | Admitting: Internal Medicine

## 2021-05-21 NOTE — Telephone Encounter (Signed)
Pt was scheduled with Dr Marletta Lor today for procedure and needs to reschedule. [phone]

## 2021-05-21 NOTE — Telephone Encounter (Signed)
Called pt, no answer, LMOVM

## 2021-05-21 NOTE — Telephone Encounter (Signed)
Patient returned call. Has been scheduled for 9/12 at 9:15am. Aware will mail new prep instructions. He already has his prep at home per pt. Confirmed address. Called endo and made aware.

## 2021-06-20 NOTE — Patient Instructions (Signed)
[name]  06/20/2021     @PREFPERIOPPHARMACY @   Your procedure is scheduled on  06/25/2021.   Report to 08/25/2021 at  0715  A.M.   Call this number if you have problems the morning of surgery:  [phone]   Remember:  Follow the diet and prep instructions given to you by the office.    Take these medicines the morning of surgery with A SIP OF WATER         antivert(if needed), mobic (if needed), oxycodone (if needed).     Do not wear jewelry, make-up or nail polish.  Do not wear lotions, powders, or perfumes, or deodorant.  Do not shave 48 hours prior to surgery.  Men may shave face and neck.  Do not bring valuables to the hospital.  [facility] is not responsible for any belongings or valuables.  Contacts, dentures or bridgework may not be worn into surgery.  Leave your suitcase in the car.  After surgery it may be brought to your room.  For patients admitted to the hospital, discharge time will be determined by your treatment team.  Patients discharged the day of surgery will not be allowed to drive home and must have someone with them for 24 hours.    Special instructions:  DO NOT smoke tobacco or vape for 24 hours before your procedure.  Please read over the following fact sheets that you were given. Anesthesia Post-op Instructions and Care and Recovery After Surgery      Colonoscopy, Adult, Care After This sheet gives you information about how to care for yourself after your procedure. Your health care provider may also give you more specific instructions. If you have problems or questions, contact your health care provider. What can I expect after the procedure? After the procedure, it is common to have: A small amount of blood in your stool for 24 hours after the procedure. Some gas. Mild cramping or bloating of your abdomen. Follow these instructions at home: Eating and drinking  Drink enough fluid to keep your urine pale yellow. Follow  instructions from your health care provider about eating or drinking restrictions. Resume your normal diet as instructed by your health care provider. Avoid heavy or fried foods that are hard to digest. Activity Rest as told by your health care provider. Avoid sitting for a long time without moving. Get up to take short walks every 1-2 hours. This is important to improve blood flow and breathing. Ask for help if you feel weak or unsteady. Return to your normal activities as told by your health care provider. Ask your health care provider what activities are safe for you. Managing cramping and bloating  Try walking around when you have cramps or feel bloated. Apply heat to your abdomen as told by your health care provider. Use the heat source that your health care provider recommends, such as a moist heat pack or a heating pad. Place a towel between your skin and the heat source. Leave the heat on for 20-30 minutes. Remove the heat if your skin turns bright red. This is especially important if you are unable to feel pain, heat, or cold. You may have a greater risk of getting burned. General instructions If you were given a sedative during the procedure, it can affect you for several hours. Do not drive or operate machinery until your health care provider says that it is safe. For the first 24 hours after the  procedure: Do not sign important documents. Do not drink alcohol. Do your regular daily activities at a slower pace than normal. Eat soft foods that are easy to digest. Take over-the-counter and prescription medicines only as told by your health care provider. Keep all follow-up visits as told by your health care provider. This is important. Contact a health care provider if: You have blood in your stool 2-3 days after the procedure. Get help right away if you have: More than a small spotting of blood in your stool. Large blood clots in your stool. Swelling of your abdomen. Nausea or  vomiting. A fever. Increasing pain in your abdomen that is not relieved with medicine. Summary After the procedure, it is common to have a small amount of blood in your stool. You may also have mild cramping and bloating of your abdomen. If you were given a sedative during the procedure, it can affect you for several hours. Do not drive or operate machinery until your health care provider says that it is safe. Get help right away if you have a lot of blood in your stool, nausea or vomiting, a fever, or increased pain in your abdomen. This information is not intended to replace advice given to you by your health care provider. Make sure you discuss any questions you have with your health care provider. Document Revised: 09/24/2019 Document Reviewed: 04/26/2019 Elsevier Patient Education  2022 [employer]. Monitored Anesthesia Care, Care After This sheet gives you information about how to care for yourself after your procedure. Your health care provider may also give you more specific instructions. If you have problems or questions, contact your health care provider. What can I expect after the procedure? After the procedure, it is common to have: Tiredness. Forgetfulness about what happened after the procedure. Impaired judgment for important decisions. Nausea or vomiting. Some difficulty with balance. Follow these instructions at home: For the time period you were told by your health care provider:   Rest as needed. Do not participate in activities where you could fall or become injured. Do not drive or use machinery. Do not drink alcohol. Do not take sleeping pills or medicines that cause drowsiness. Do not make important decisions or sign legal documents. Do not take care of children on your own. Eating and drinking Follow the diet that is recommended by your health care provider. Drink enough fluid to keep your urine pale yellow. If you vomit: Drink water, juice, or soup when  you can drink without vomiting. Make sure you have little or no nausea before eating solid foods. General instructions Have a responsible adult stay with you for the time you are told. It is important to have someone help care for you until you are awake and alert. Take over-the-counter and prescription medicines only as told by your health care provider. If you have sleep apnea, surgery and certain medicines can increase your risk for breathing problems. Follow instructions from your health care provider about wearing your sleep device: Anytime you are sleeping, including during daytime naps. While taking prescription pain medicines, sleeping medicines, or medicines that make you drowsy. Avoid smoking. Keep all follow-up visits as told by your health care provider. This is important. Contact a health care provider if: You keep feeling nauseous or you keep vomiting. You feel light-headed. You are still sleepy or having trouble with balance after 24 hours. You develop a rash. You have a fever. You have redness or swelling around the IV site. Get help right  away if: You have trouble breathing. You have new-onset confusion at home. Summary For several hours after your procedure, you may feel tired. You may also be forgetful and have poor judgment. Have a responsible adult stay with you for the time you are told. It is important to have someone help care for you until you are awake and alert. Rest as told. Do not drive or operate machinery. Do not drink alcohol or take sleeping pills. Get help right away if you have trouble breathing, or if you suddenly become confused. This information is not intended to replace advice given to you by your health care provider. Make sure you discuss any questions you have with your health care provider. Document Revised: 06/15/2020 Document Reviewed: 09/02/2019 Elsevier Patient Education  2022 [employer].

## 2021-06-21 ENCOUNTER — Encounter (HOSPITAL_COMMUNITY): Payer: Self-pay

## 2021-06-21 ENCOUNTER — Telehealth: Payer: Self-pay | Admitting: *Deleted

## 2021-06-21 ENCOUNTER — Other Ambulatory Visit: Payer: Self-pay

## 2021-06-21 ENCOUNTER — Encounter (HOSPITAL_COMMUNITY)
Admission: RE | Admit: 2021-06-21 | Discharge: 2021-06-21 | Disposition: A | Discharge status: Home / Self Care | Payer: Medicare Other | Source: Ambulatory Visit | Attending: Internal Medicine | Admitting: Internal Medicine

## 2021-06-21 DIAGNOSIS — Z01818 Encounter for other preprocedural examination: Secondary | ICD-10-CM | POA: Diagnosis not present

## 2021-06-21 HISTORY — DX: Dyspnea, unspecified: R06.00

## 2021-06-21 HISTORY — DX: Unspecified asthma, uncomplicated: J45.909

## 2021-06-21 HISTORY — DX: Unspecified osteoarthritis, unspecified site: M19.90

## 2021-06-21 HISTORY — DX: Sleep apnea, unspecified: G47.30

## 2021-06-21 LAB — BASIC METABOLIC PANEL
Anion gap: 6 (ref 5–15)
BUN: 21 mg/dL — ABNORMAL HIGH (ref 6–20)
CO2: 28 mmol/L (ref 22–32)
Calcium: 8.5 mg/dL — ABNORMAL LOW (ref 8.9–10.3)
Chloride: 102 mmol/L (ref 98–111)
Creatinine, Ser: 0.99 mg/dL (ref 0.61–1.24)
GFR, Estimated: >60 mL/min (ref >60)
Glucose, Bld: 95 mg/dL (ref 70–99)
Potassium: 3.6 mmol/L (ref 3.5–5.1)
Sodium: 136 mmol/L (ref 135–145)

## 2021-06-21 NOTE — Telephone Encounter (Signed)
FYI to Dr. Carver 

## 2021-06-21 NOTE — Progress Notes (Signed)
Patient presents to PAT with BP readings 186/120 and 168/100.  He will follow up with his primary care provider to readjust medications. He hasn't has any blood pressure medications refilled in the last year. His siginifcant other is concerned that he is probably not taking his meds. We will cancel his procedure for now until he follows up with primary doctor and BP is under control.

## 2021-06-21 NOTE — Telephone Encounter (Signed)
-----   Message from Jethro Bolus, RN sent at 06/21/2021 12:50 PM EDT ----- Regarding: Cancellation Colonoscopy cancelled due to elevated BP and noncompliance with meds.  He will follow up with his primary provider for readjustment of meds.  We will reschedule after patient is stable since this is a screening colonoscopy and not an emergency per Dr Emeterio Reeve.

## 2021-06-22 ENCOUNTER — Encounter (HOSPITAL_COMMUNITY)
Admission: RE | Admit: 2021-06-22 | Discharge: 2021-06-22 | Disposition: A | Discharge status: Home / Self Care | Payer: Medicare Other | Source: Ambulatory Visit | Attending: Internal Medicine | Admitting: Internal Medicine

## 2021-06-25 ENCOUNTER — Encounter (HOSPITAL_COMMUNITY): Admission: RE | Payer: Self-pay | Source: Home / Self Care

## 2021-06-25 ENCOUNTER — Ambulatory Visit (HOSPITAL_COMMUNITY): Admission: RE | Admit: 2021-06-25 | Payer: Medicare Other | Source: Home / Self Care

## 2021-06-25 SURGERY — COLONOSCOPY WITH PROPOFOL
Anesthesia: Monitor Anesthesia Care

## 2021-07-09 ENCOUNTER — Other Ambulatory Visit: Payer: Self-pay

## 2021-07-09 ENCOUNTER — Emergency Department (HOSPITAL_COMMUNITY): Payer: Medicare Other

## 2021-07-09 ENCOUNTER — Emergency Department (HOSPITAL_COMMUNITY)
Admission: EM | Admit: 2021-07-09 | Discharge: 2021-07-09 | Disposition: A | Discharge status: Home / Self Care | Payer: Medicare Other | Attending: Emergency Medicine | Admitting: Emergency Medicine

## 2021-07-09 DIAGNOSIS — M25511 Pain in right shoulder: Secondary | ICD-10-CM | POA: Diagnosis not present

## 2021-07-09 DIAGNOSIS — S40011A Contusion of right shoulder, initial encounter: Secondary | ICD-10-CM | POA: Diagnosis not present

## 2021-07-09 DIAGNOSIS — J45909 Unspecified asthma, uncomplicated: Secondary | ICD-10-CM | POA: Diagnosis not present

## 2021-07-09 DIAGNOSIS — W109XXA Fall (on) (from) unspecified stairs and steps, initial encounter: Secondary | ICD-10-CM | POA: Insufficient documentation

## 2021-07-09 DIAGNOSIS — Z87891 Personal history of nicotine dependence: Secondary | ICD-10-CM | POA: Insufficient documentation

## 2021-07-09 DIAGNOSIS — Z79899 Other long term (current) drug therapy: Secondary | ICD-10-CM | POA: Diagnosis not present

## 2021-07-09 DIAGNOSIS — S8001XA Contusion of right knee, initial encounter: Secondary | ICD-10-CM | POA: Insufficient documentation

## 2021-07-09 DIAGNOSIS — J3489 Other specified disorders of nose and nasal sinuses: Secondary | ICD-10-CM | POA: Diagnosis not present

## 2021-07-09 DIAGNOSIS — S0990XA Unspecified injury of head, initial encounter: Secondary | ICD-10-CM | POA: Diagnosis not present

## 2021-07-09 DIAGNOSIS — R42 Dizziness and giddiness: Secondary | ICD-10-CM | POA: Diagnosis not present

## 2021-07-09 DIAGNOSIS — M7989 Other specified soft tissue disorders: Secondary | ICD-10-CM | POA: Diagnosis not present

## 2021-07-09 DIAGNOSIS — I10 Essential (primary) hypertension: Secondary | ICD-10-CM | POA: Insufficient documentation

## 2021-07-09 DIAGNOSIS — W19XXXA Unspecified fall, initial encounter: Secondary | ICD-10-CM

## 2021-07-09 DIAGNOSIS — M25561 Pain in right knee: Secondary | ICD-10-CM | POA: Diagnosis not present

## 2021-07-09 DIAGNOSIS — S8991XA Unspecified injury of right lower leg, initial encounter: Secondary | ICD-10-CM | POA: Diagnosis present

## 2021-07-09 LAB — CBC WITH DIFFERENTIAL/PLATELET
Abs Immature Granulocytes: 0.02 10*3/uL (ref 0.00–0.07)
Basophils Absolute: 0 10*3/uL (ref 0.0–0.1)
Basophils Relative: 1 %
Eosinophils Absolute: 0.3 10*3/uL (ref 0.0–0.5)
Eosinophils Relative: 4 %
HCT: 45 % (ref 39.0–52.0)
Hemoglobin: 14.6 g/dL (ref 13.0–17.0)
Immature Granulocytes: 0 %
Lymphocytes Relative: 22 %
Lymphs Abs: 1.4 10*3/uL (ref 0.7–4.0)
MCH: 27.8 pg (ref 26.0–34.0)
MCHC: 32.4 g/dL (ref 30.0–36.0)
MCV: 85.6 fL (ref 80.0–100.0)
Monocytes Absolute: 0.6 10*3/uL (ref 0.1–1.0)
Monocytes Relative: 9 %
Neutro Abs: 4 10*3/uL (ref 1.7–7.7)
Neutrophils Relative %: 64 %
Platelets: 287 10*3/uL (ref 150–400)
RBC: 5.26 MIL/uL (ref 4.22–5.81)
RDW: 14.9 % (ref 11.5–15.5)
WBC: 6.2 10*3/uL (ref 4.0–10.5)
nRBC: 0 % (ref 0.0–0.2)

## 2021-07-09 LAB — BASIC METABOLIC PANEL
Anion gap: 10 (ref 5–15)
BUN: 26 mg/dL — ABNORMAL HIGH (ref 6–20)
CO2: 28 mmol/L (ref 22–32)
Calcium: 9.1 mg/dL (ref 8.9–10.3)
Chloride: 101 mmol/L (ref 98–111)
Creatinine, Ser: 1.86 mg/dL — ABNORMAL HIGH (ref 0.61–1.24)
GFR, Estimated: 41 mL/min — ABNORMAL LOW (ref >60)
Glucose, Bld: 109 mg/dL — ABNORMAL HIGH (ref 70–99)
Potassium: 3.7 mmol/L (ref 3.5–5.1)
Sodium: 139 mmol/L (ref 135–145)

## 2021-07-09 MED ORDER — HYDRALAZINE HCL 20 MG/ML IJ SOLN
10.0000 mg | Freq: Once | INTRAMUSCULAR | Status: AC
  Administered 2021-07-09: 10 mg via INTRAVENOUS
  Filled 2021-07-09: qty 1

## 2021-07-09 MED ORDER — LABETALOL HCL 5 MG/ML IV SOLN
10.0000 mg | Freq: Once | INTRAVENOUS | Status: AC
  Administered 2021-07-09: 10 mg via INTRAVENOUS
  Filled 2021-07-09: qty 4

## 2021-07-09 MED ORDER — METHOCARBAMOL 500 MG PO TABS: 500.0000 mg | ORAL_TABLET | Freq: Two times a day (BID) | ORAL | 0 refills | Status: DC

## 2021-07-09 MED ORDER — ACETAMINOPHEN 325 MG PO TABS
650.0000 mg | ORAL_TABLET | Freq: Once | ORAL | Status: AC
  Administered 2021-07-09: 650 mg via ORAL
  Filled 2021-07-09: qty 2

## 2021-07-09 MED ORDER — AMLODIPINE BESYLATE 5 MG PO TABS: 5.0000 mg | ORAL_TABLET | Freq: Every day | ORAL | 0 refills | Status: DC

## 2021-07-09 MED ORDER — OXYCODONE-ACETAMINOPHEN 5-325 MG PO TABS
1.0000 | ORAL_TABLET | Freq: Once | ORAL | Status: AC
  Administered 2021-07-09: 1 via ORAL
  Filled 2021-07-09: qty 1

## 2021-07-09 MED ORDER — SODIUM CHLORIDE 0.9 % IV BOLUS
1000.0000 mL | Freq: Once | INTRAVENOUS | Status: AC
Start: 2021-07-09 — End: 2021-07-09
  Administered 2021-07-09: 1000 mL via INTRAVENOUS

## 2021-07-09 NOTE — ED Provider Notes (Signed)
Emergency Medicine Provider Triage Evaluation Note  [name] , a 60 y.o. male  was evaluated in triage.  Pt complains of fall that he describes as mechanical fall that occurred yesterday.  He states since yesterday he has had right shoulder right knee pain and headaches he states that he has had some bilateral blurry vision.  He did not lose consciousness but states he did strike the side of his head.  He states that he fell onto his right side.  He denies any vertigo or lightheadedness since the fall but describes dizziness as blurry vision. Not on any blood thinners.  Does not have a history of blurry vision.  Review of Systems  Positive: Fatigue, headache, head trauma, right shoulder pain, right knee pain Negative: Vertigo  Physical Exam  BP (!) 164/114 (BP Location: Left Arm)   Pulse 86   Temp 98 F (36.7 C) (Oral)   Resp 16   SpO2 94%  Gen:   Awake, no distress   Resp:  Normal effort  MSK:   Moves extremities without difficulty  Other:  Moves all 4 extremities some palpable tenderness in the right shoulder any.  Head is grossly atraumatic, cervical spine without any midline tenderness palpation. Smile symmetric speaking clearly with no slurred speech.  Alert and oriented x3.  Medical Decision Making  Medically screening exam initiated at 10:11 AM.  Appropriate orders placed.  [name] was informed that the remainder of the evaluation will be completed by another provider, this initial triage assessment does not replace that evaluation, and the importance of remaining in the ED until their evaluation is complete.  Patient here for mechanical fall that occurred yesterday endorses headaches, right shoulder any pain and some blurry vision and fatigue since then.  Will obtain EKG, basic labs, CT head, x-rays of right shoulder right knee.   Solon Augusta [place], [place] 07/09/21 1028    Milagros Loll, MD 07/09/21 [phone]

## 2021-07-09 NOTE — ED Triage Notes (Signed)
Pt reports mechanical fall yesterday where he slipped off a stair while his grandson was chasing him. Has residual R shoulder, R knee, and R sided head pain. No blood thinners, no LOC. Also reports dizziness x 3 days, but it did not cause him to fall.

## 2021-07-09 NOTE — Discharge Instructions (Addendum)
Please follow-up with your primary care doctor to talk about your elevated blood pressure.  Please take your amlodipine I have prescribed you as well as your home pressure medicine.  Please take Tylenol 1000 mg every 6 hours for pain.  I also prescribed you a muscle relaxer and you may use some ibuprofen at home to.  Warm compresses to the area will help with discomfort stretching Epson salt soaks.  You will need to have your kidney function checked in the next 4 to 5 days.  You may return to the ER if you are unable to follow-up with your PCP in that time.

## 2021-07-09 NOTE — ED Notes (Signed)
Patient verbalizes understanding of discharge instructions. Prescriptions and follow-up care reviewed. Opportunity for questioning and answers were provided. Armband removed by staff, pt discharged from ED ambulatory.  
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
## 2021-07-12 DIAGNOSIS — I10 Essential (primary) hypertension: Secondary | ICD-10-CM | POA: Diagnosis not present

## 2021-07-12 DIAGNOSIS — M130 Polyarthritis, unspecified: Secondary | ICD-10-CM | POA: Diagnosis not present

## 2021-07-12 DIAGNOSIS — G8190 Hemiplegia, unspecified affecting unspecified side: Secondary | ICD-10-CM | POA: Diagnosis not present

## 2021-07-12 DIAGNOSIS — R7303 Prediabetes: Secondary | ICD-10-CM | POA: Diagnosis not present

## 2021-07-12 DIAGNOSIS — L83 Acanthosis nigricans: Secondary | ICD-10-CM | POA: Diagnosis not present

## 2021-07-12 DIAGNOSIS — G8921 Chronic pain due to trauma: Secondary | ICD-10-CM | POA: Diagnosis not present

## 2021-07-12 DIAGNOSIS — G8194 Hemiplegia, unspecified affecting left nondominant side: Secondary | ICD-10-CM | POA: Diagnosis not present

## 2021-07-26 DIAGNOSIS — R609 Edema, unspecified: Secondary | ICD-10-CM | POA: Diagnosis not present

## 2021-07-26 DIAGNOSIS — G8194 Hemiplegia, unspecified affecting left nondominant side: Secondary | ICD-10-CM | POA: Diagnosis not present

## 2021-07-26 DIAGNOSIS — G8921 Chronic pain due to trauma: Secondary | ICD-10-CM | POA: Diagnosis not present

## 2021-07-26 DIAGNOSIS — E213 Hyperparathyroidism, unspecified: Secondary | ICD-10-CM | POA: Diagnosis not present

## 2021-07-26 DIAGNOSIS — M130 Polyarthritis, unspecified: Secondary | ICD-10-CM | POA: Diagnosis not present

## 2021-07-26 DIAGNOSIS — I10 Essential (primary) hypertension: Secondary | ICD-10-CM | POA: Diagnosis not present

## 2021-07-30 ENCOUNTER — Telehealth: Payer: Self-pay | Admitting: Internal Medicine

## 2021-07-30 NOTE — Telephone Encounter (Signed)
Patient needs OV to reschedule as he has not been seen since June.

## 2021-07-30 NOTE — Telephone Encounter (Signed)
noted 

## 2021-07-30 NOTE — Telephone Encounter (Signed)
I spoke with patient earlier this morning and he wanted to reschedule his procedure. Per scheduler the patient needed another OV since we haven't seen him since June. I LMOM for patient and he called back saying this was ridiculous and he was going to find somewhere else that could see him sooner. I offered him an appt in Dec with Dr Marletta Lor, but he said that was over another month away. I asked if he wanted me to cancel OV and he said yes.

## 2021-07-30 NOTE — Telephone Encounter (Signed)
Pt called to reschedule his procedure that was canceled in September. [phone]

## 2021-08-16 DIAGNOSIS — I10 Essential (primary) hypertension: Secondary | ICD-10-CM | POA: Diagnosis not present

## 2021-09-13 ENCOUNTER — Ambulatory Visit: Payer: Medicare Other | Admitting: Internal Medicine
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
## 2021-12-18 ENCOUNTER — Encounter: Payer: Self-pay | Admitting: Internal Medicine

## 2021-12-18 ENCOUNTER — Ambulatory Visit: Payer: Medicare Other | Admitting: Gastroenterology

## 2023-02-12 DIAGNOSIS — Z79899 Other long term (current) drug therapy: Secondary | ICD-10-CM | POA: Diagnosis not present

## 2023-02-14 DIAGNOSIS — E559 Vitamin D deficiency, unspecified: Secondary | ICD-10-CM | POA: Diagnosis not present

## 2023-02-14 DIAGNOSIS — E0590 Thyrotoxicosis, unspecified without thyrotoxic crisis or storm: Secondary | ICD-10-CM | POA: Diagnosis not present

## 2023-02-14 DIAGNOSIS — R7303 Prediabetes: Secondary | ICD-10-CM | POA: Diagnosis not present

## 2023-02-14 DIAGNOSIS — Z79899 Other long term (current) drug therapy: Secondary | ICD-10-CM | POA: Diagnosis not present

## 2023-02-14 DIAGNOSIS — Z9181 History of falling: Secondary | ICD-10-CM | POA: Diagnosis not present

## 2023-02-14 DIAGNOSIS — R030 Elevated blood-pressure reading, without diagnosis of hypertension: Secondary | ICD-10-CM | POA: Diagnosis not present

## 2023-02-14 DIAGNOSIS — R768 Other specified abnormal immunological findings in serum: Secondary | ICD-10-CM | POA: Diagnosis not present

## 2023-02-14 DIAGNOSIS — M549 Dorsalgia, unspecified: Secondary | ICD-10-CM | POA: Diagnosis not present

## 2023-02-14 DIAGNOSIS — I10 Essential (primary) hypertension: Secondary | ICD-10-CM | POA: Diagnosis not present

## 2023-02-14 DIAGNOSIS — E790 Hyperuricemia without signs of inflammatory arthritis and tophaceous disease: Secondary | ICD-10-CM | POA: Diagnosis not present

## 2023-02-19 DIAGNOSIS — Z79899 Other long term (current) drug therapy: Secondary | ICD-10-CM | POA: Diagnosis not present

## 2023-02-28 DIAGNOSIS — R030 Elevated blood-pressure reading, without diagnosis of hypertension: Secondary | ICD-10-CM | POA: Diagnosis not present

## 2023-02-28 DIAGNOSIS — Z9181 History of falling: Secondary | ICD-10-CM | POA: Diagnosis not present

## 2023-02-28 DIAGNOSIS — Z79899 Other long term (current) drug therapy: Secondary | ICD-10-CM | POA: Diagnosis not present

## 2023-02-28 DIAGNOSIS — I10 Essential (primary) hypertension: Secondary | ICD-10-CM | POA: Diagnosis not present

## 2023-02-28 DIAGNOSIS — M549 Dorsalgia, unspecified: Secondary | ICD-10-CM | POA: Diagnosis not present

## 2023-02-28 DIAGNOSIS — E559 Vitamin D deficiency, unspecified: Secondary | ICD-10-CM | POA: Diagnosis not present

## 2023-02-28 DIAGNOSIS — E790 Hyperuricemia without signs of inflammatory arthritis and tophaceous disease: Secondary | ICD-10-CM | POA: Diagnosis not present

## 2023-02-28 DIAGNOSIS — E0590 Thyrotoxicosis, unspecified without thyrotoxic crisis or storm: Secondary | ICD-10-CM | POA: Diagnosis not present

## 2023-02-28 DIAGNOSIS — R768 Other specified abnormal immunological findings in serum: Secondary | ICD-10-CM | POA: Diagnosis not present

## 2023-02-28 DIAGNOSIS — R7303 Prediabetes: Secondary | ICD-10-CM | POA: Diagnosis not present

## 2023-03-07 DIAGNOSIS — Z79899 Other long term (current) drug therapy: Secondary | ICD-10-CM | POA: Diagnosis not present

## 2023-03-11 DIAGNOSIS — M130 Polyarthritis, unspecified: Secondary | ICD-10-CM | POA: Diagnosis not present

## 2023-03-11 DIAGNOSIS — I10 Essential (primary) hypertension: Secondary | ICD-10-CM | POA: Diagnosis not present

## 2023-03-11 DIAGNOSIS — E1169 Type 2 diabetes mellitus with other specified complication: Secondary | ICD-10-CM | POA: Diagnosis not present

## 2023-03-11 DIAGNOSIS — G8194 Hemiplegia, unspecified affecting left nondominant side: Secondary | ICD-10-CM | POA: Diagnosis not present

## 2023-03-14 DIAGNOSIS — M25561 Pain in right knee: Secondary | ICD-10-CM | POA: Diagnosis not present

## 2023-03-14 DIAGNOSIS — Z79899 Other long term (current) drug therapy: Secondary | ICD-10-CM | POA: Diagnosis not present

## 2023-03-14 DIAGNOSIS — M25571 Pain in right ankle and joints of right foot: Secondary | ICD-10-CM | POA: Diagnosis not present

## 2023-03-14 DIAGNOSIS — D169 Benign neoplasm of bone and articular cartilage, unspecified: Secondary | ICD-10-CM | POA: Diagnosis not present

## 2023-03-14 DIAGNOSIS — M25511 Pain in right shoulder: Secondary | ICD-10-CM | POA: Diagnosis not present

## 2023-04-03 DIAGNOSIS — M25569 Pain in unspecified knee: Secondary | ICD-10-CM | POA: Diagnosis not present

## 2023-04-03 DIAGNOSIS — M25571 Pain in right ankle and joints of right foot: Secondary | ICD-10-CM | POA: Diagnosis not present

## 2023-04-03 DIAGNOSIS — I10 Essential (primary) hypertension: Secondary | ICD-10-CM | POA: Diagnosis not present

## 2023-04-28 DIAGNOSIS — R7303 Prediabetes: Secondary | ICD-10-CM | POA: Diagnosis not present

## 2023-04-28 DIAGNOSIS — E0590 Thyrotoxicosis, unspecified without thyrotoxic crisis or storm: Secondary | ICD-10-CM | POA: Diagnosis not present

## 2023-04-28 DIAGNOSIS — M549 Dorsalgia, unspecified: Secondary | ICD-10-CM | POA: Diagnosis not present

## 2023-04-28 DIAGNOSIS — I10 Essential (primary) hypertension: Secondary | ICD-10-CM | POA: Diagnosis not present

## 2023-04-28 DIAGNOSIS — Z9181 History of falling: Secondary | ICD-10-CM | POA: Diagnosis not present

## 2023-04-28 DIAGNOSIS — E790 Hyperuricemia without signs of inflammatory arthritis and tophaceous disease: Secondary | ICD-10-CM | POA: Diagnosis not present

## 2023-04-28 DIAGNOSIS — R768 Other specified abnormal immunological findings in serum: Secondary | ICD-10-CM | POA: Diagnosis not present

## 2023-04-28 DIAGNOSIS — M129 Arthropathy, unspecified: Secondary | ICD-10-CM | POA: Diagnosis not present

## 2023-04-28 DIAGNOSIS — R030 Elevated blood-pressure reading, without diagnosis of hypertension: Secondary | ICD-10-CM | POA: Diagnosis not present

## 2023-04-28 DIAGNOSIS — Z79899 Other long term (current) drug therapy: Secondary | ICD-10-CM | POA: Diagnosis not present

## 2023-04-28 DIAGNOSIS — E559 Vitamin D deficiency, unspecified: Secondary | ICD-10-CM | POA: Diagnosis not present

## 2023-04-30 DIAGNOSIS — E782 Mixed hyperlipidemia: Secondary | ICD-10-CM | POA: Diagnosis not present

## 2023-04-30 DIAGNOSIS — I10 Essential (primary) hypertension: Secondary | ICD-10-CM | POA: Diagnosis not present

## 2023-04-30 DIAGNOSIS — E039 Hypothyroidism, unspecified: Secondary | ICD-10-CM | POA: Diagnosis not present

## 2023-04-30 DIAGNOSIS — E1169 Type 2 diabetes mellitus with other specified complication: Secondary | ICD-10-CM | POA: Diagnosis not present

## 2023-05-01 DIAGNOSIS — Z79899 Other long term (current) drug therapy: Secondary | ICD-10-CM | POA: Diagnosis not present

## 2023-05-02 DIAGNOSIS — G8194 Hemiplegia, unspecified affecting left nondominant side: Secondary | ICD-10-CM | POA: Diagnosis not present

## 2023-05-02 DIAGNOSIS — R2689 Other abnormalities of gait and mobility: Secondary | ICD-10-CM | POA: Diagnosis not present

## 2023-05-02 DIAGNOSIS — M6281 Muscle weakness (generalized): Secondary | ICD-10-CM | POA: Diagnosis not present

## 2023-05-02 DIAGNOSIS — R2681 Unsteadiness on feet: Secondary | ICD-10-CM | POA: Diagnosis not present

## 2023-05-29 DIAGNOSIS — E790 Hyperuricemia without signs of inflammatory arthritis and tophaceous disease: Secondary | ICD-10-CM | POA: Diagnosis not present

## 2023-05-29 DIAGNOSIS — Z9181 History of falling: Secondary | ICD-10-CM | POA: Diagnosis not present

## 2023-05-29 DIAGNOSIS — R768 Other specified abnormal immunological findings in serum: Secondary | ICD-10-CM | POA: Diagnosis not present

## 2023-05-29 DIAGNOSIS — R030 Elevated blood-pressure reading, without diagnosis of hypertension: Secondary | ICD-10-CM | POA: Diagnosis not present

## 2023-05-29 DIAGNOSIS — M549 Dorsalgia, unspecified: Secondary | ICD-10-CM | POA: Diagnosis not present

## 2023-05-29 DIAGNOSIS — Z79899 Other long term (current) drug therapy: Secondary | ICD-10-CM | POA: Diagnosis not present

## 2023-05-29 DIAGNOSIS — I10 Essential (primary) hypertension: Secondary | ICD-10-CM | POA: Diagnosis not present

## 2023-05-29 DIAGNOSIS — R7303 Prediabetes: Secondary | ICD-10-CM | POA: Diagnosis not present

## 2023-06-09 DIAGNOSIS — R262 Difficulty in walking, not elsewhere classified: Secondary | ICD-10-CM | POA: Diagnosis not present

## 2023-06-11 DIAGNOSIS — R262 Difficulty in walking, not elsewhere classified: Secondary | ICD-10-CM | POA: Diagnosis not present

## 2023-06-12 DIAGNOSIS — R768 Other specified abnormal immunological findings in serum: Secondary | ICD-10-CM | POA: Diagnosis not present

## 2023-06-12 DIAGNOSIS — E790 Hyperuricemia without signs of inflammatory arthritis and tophaceous disease: Secondary | ICD-10-CM | POA: Diagnosis not present

## 2023-06-12 DIAGNOSIS — G894 Chronic pain syndrome: Secondary | ICD-10-CM | POA: Diagnosis not present

## 2023-06-12 DIAGNOSIS — M1991 Primary osteoarthritis, unspecified site: Secondary | ICD-10-CM | POA: Diagnosis not present

## 2023-06-17 DIAGNOSIS — R262 Difficulty in walking, not elsewhere classified: Secondary | ICD-10-CM | POA: Diagnosis not present

## 2023-06-18 DIAGNOSIS — E213 Hyperparathyroidism, unspecified: Secondary | ICD-10-CM | POA: Diagnosis not present

## 2023-06-18 DIAGNOSIS — I10 Essential (primary) hypertension: Secondary | ICD-10-CM | POA: Diagnosis not present

## 2023-06-18 DIAGNOSIS — J441 Chronic obstructive pulmonary disease with (acute) exacerbation: Secondary | ICD-10-CM | POA: Diagnosis not present

## 2023-06-18 DIAGNOSIS — M130 Polyarthritis, unspecified: Secondary | ICD-10-CM | POA: Diagnosis not present

## 2023-06-18 DIAGNOSIS — R0902 Hypoxemia: Secondary | ICD-10-CM | POA: Diagnosis not present

## 2023-06-18 DIAGNOSIS — E1169 Type 2 diabetes mellitus with other specified complication: Secondary | ICD-10-CM | POA: Diagnosis not present

## 2023-06-20 DIAGNOSIS — R262 Difficulty in walking, not elsewhere classified: Secondary | ICD-10-CM | POA: Diagnosis not present

## 2023-06-23 ENCOUNTER — Other Ambulatory Visit (HOSPITAL_COMMUNITY): Payer: Self-pay | Admitting: Family Medicine

## 2023-06-23 ENCOUNTER — Ambulatory Visit (HOSPITAL_COMMUNITY)
Admission: RE | Admit: 2023-06-23 | Discharge: 2023-06-23 | Disposition: A | Discharge status: Home / Self Care | Payer: 59 | Source: Ambulatory Visit | Attending: Family Medicine | Admitting: Family Medicine

## 2023-06-23 DIAGNOSIS — R079 Chest pain, unspecified: Secondary | ICD-10-CM | POA: Diagnosis not present

## 2023-06-23 DIAGNOSIS — R0902 Hypoxemia: Secondary | ICD-10-CM | POA: Diagnosis not present

## 2023-06-23 DIAGNOSIS — R0602 Shortness of breath: Secondary | ICD-10-CM | POA: Diagnosis not present

## 2023-06-26 DIAGNOSIS — R262 Difficulty in walking, not elsewhere classified: Secondary | ICD-10-CM | POA: Diagnosis not present

## 2023-06-27 DIAGNOSIS — R262 Difficulty in walking, not elsewhere classified: Secondary | ICD-10-CM | POA: Diagnosis not present

## 2023-06-30 DIAGNOSIS — Z79899 Other long term (current) drug therapy: Secondary | ICD-10-CM | POA: Diagnosis not present

## 2023-06-30 DIAGNOSIS — M549 Dorsalgia, unspecified: Secondary | ICD-10-CM | POA: Diagnosis not present

## 2023-06-30 DIAGNOSIS — R768 Other specified abnormal immunological findings in serum: Secondary | ICD-10-CM | POA: Diagnosis not present

## 2023-06-30 DIAGNOSIS — Z9181 History of falling: Secondary | ICD-10-CM | POA: Diagnosis not present

## 2023-07-01 DIAGNOSIS — M130 Polyarthritis, unspecified: Secondary | ICD-10-CM | POA: Diagnosis not present

## 2023-07-01 DIAGNOSIS — E782 Mixed hyperlipidemia: Secondary | ICD-10-CM | POA: Diagnosis not present

## 2023-07-01 DIAGNOSIS — E1169 Type 2 diabetes mellitus with other specified complication: Secondary | ICD-10-CM | POA: Diagnosis not present

## 2023-07-01 DIAGNOSIS — G8194 Hemiplegia, unspecified affecting left nondominant side: Secondary | ICD-10-CM | POA: Diagnosis not present

## 2023-07-01 DIAGNOSIS — I10 Essential (primary) hypertension: Secondary | ICD-10-CM | POA: Diagnosis not present

## 2023-07-01 DIAGNOSIS — J441 Chronic obstructive pulmonary disease with (acute) exacerbation: Secondary | ICD-10-CM | POA: Diagnosis not present

## 2023-07-03 DIAGNOSIS — Z79899 Other long term (current) drug therapy: Secondary | ICD-10-CM | POA: Diagnosis not present

## 2023-07-04 DIAGNOSIS — R262 Difficulty in walking, not elsewhere classified: Secondary | ICD-10-CM | POA: Diagnosis not present

## 2023-07-07 DIAGNOSIS — R262 Difficulty in walking, not elsewhere classified: Secondary | ICD-10-CM | POA: Diagnosis not present

## 2023-07-09 DIAGNOSIS — F17218 Nicotine dependence, cigarettes, with other nicotine-induced disorders: Secondary | ICD-10-CM | POA: Diagnosis not present

## 2023-07-09 DIAGNOSIS — F17200 Nicotine dependence, unspecified, uncomplicated: Secondary | ICD-10-CM | POA: Diagnosis not present

## 2023-07-09 DIAGNOSIS — I519 Heart disease, unspecified: Secondary | ICD-10-CM | POA: Diagnosis not present

## 2023-07-09 DIAGNOSIS — R079 Chest pain, unspecified: Secondary | ICD-10-CM | POA: Diagnosis not present

## 2023-07-09 DIAGNOSIS — J90 Pleural effusion, not elsewhere classified: Secondary | ICD-10-CM | POA: Diagnosis not present

## 2023-07-09 DIAGNOSIS — I119 Hypertensive heart disease without heart failure: Secondary | ICD-10-CM | POA: Diagnosis not present

## 2023-07-09 DIAGNOSIS — G4733 Obstructive sleep apnea (adult) (pediatric): Secondary | ICD-10-CM | POA: Diagnosis not present

## 2023-07-09 DIAGNOSIS — I517 Cardiomegaly: Secondary | ICD-10-CM | POA: Diagnosis not present

## 2023-07-09 DIAGNOSIS — J9601 Acute respiratory failure with hypoxia: Secondary | ICD-10-CM | POA: Diagnosis not present

## 2023-07-09 DIAGNOSIS — R0989 Other specified symptoms and signs involving the circulatory and respiratory systems: Secondary | ICD-10-CM | POA: Diagnosis not present

## 2023-07-09 DIAGNOSIS — Z79899 Other long term (current) drug therapy: Secondary | ICD-10-CM | POA: Diagnosis not present

## 2023-07-09 DIAGNOSIS — Z743 Need for continuous supervision: Secondary | ICD-10-CM | POA: Diagnosis not present

## 2023-07-09 DIAGNOSIS — I3139 Other pericardial effusion (noninflammatory): Secondary | ICD-10-CM | POA: Diagnosis not present

## 2023-07-09 DIAGNOSIS — R0902 Hypoxemia: Secondary | ICD-10-CM | POA: Diagnosis not present

## 2023-07-09 DIAGNOSIS — R0600 Dyspnea, unspecified: Secondary | ICD-10-CM | POA: Diagnosis not present

## 2023-07-09 DIAGNOSIS — I499 Cardiac arrhythmia, unspecified: Secondary | ICD-10-CM | POA: Diagnosis not present

## 2023-07-09 DIAGNOSIS — J440 Chronic obstructive pulmonary disease with acute lower respiratory infection: Secondary | ICD-10-CM | POA: Diagnosis not present

## 2023-07-09 DIAGNOSIS — Z87891 Personal history of nicotine dependence: Secondary | ICD-10-CM | POA: Diagnosis not present

## 2023-07-09 DIAGNOSIS — J441 Chronic obstructive pulmonary disease with (acute) exacerbation: Secondary | ICD-10-CM | POA: Diagnosis not present

## 2023-07-09 DIAGNOSIS — R7303 Prediabetes: Secondary | ICD-10-CM | POA: Diagnosis not present

## 2023-07-09 DIAGNOSIS — J449 Chronic obstructive pulmonary disease, unspecified: Secondary | ICD-10-CM | POA: Diagnosis not present

## 2023-07-09 DIAGNOSIS — R0602 Shortness of breath: Secondary | ICD-10-CM | POA: Diagnosis not present

## 2023-07-09 DIAGNOSIS — I10 Essential (primary) hypertension: Secondary | ICD-10-CM | POA: Diagnosis not present

## 2023-07-09 DIAGNOSIS — R918 Other nonspecific abnormal finding of lung field: Secondary | ICD-10-CM | POA: Diagnosis not present

## 2023-07-09 DIAGNOSIS — Z7951 Long term (current) use of inhaled steroids: Secondary | ICD-10-CM | POA: Diagnosis not present

## 2023-07-10 DIAGNOSIS — J9601 Acute respiratory failure with hypoxia: Secondary | ICD-10-CM | POA: Diagnosis not present

## 2023-07-10 DIAGNOSIS — I517 Cardiomegaly: Secondary | ICD-10-CM | POA: Diagnosis not present

## 2023-07-10 DIAGNOSIS — I119 Hypertensive heart disease without heart failure: Secondary | ICD-10-CM | POA: Diagnosis not present

## 2023-07-10 DIAGNOSIS — F17200 Nicotine dependence, unspecified, uncomplicated: Secondary | ICD-10-CM | POA: Diagnosis not present

## 2023-07-10 DIAGNOSIS — I3139 Other pericardial effusion (noninflammatory): Secondary | ICD-10-CM | POA: Diagnosis not present

## 2023-07-10 DIAGNOSIS — J90 Pleural effusion, not elsewhere classified: Secondary | ICD-10-CM | POA: Diagnosis not present

## 2023-07-10 DIAGNOSIS — I519 Heart disease, unspecified: Secondary | ICD-10-CM | POA: Diagnosis not present

## 2023-07-11 DIAGNOSIS — J441 Chronic obstructive pulmonary disease with (acute) exacerbation: Secondary | ICD-10-CM | POA: Diagnosis not present

## 2023-07-11 DIAGNOSIS — R0602 Shortness of breath: Secondary | ICD-10-CM | POA: Diagnosis not present

## 2023-07-11 DIAGNOSIS — Z87891 Personal history of nicotine dependence: Secondary | ICD-10-CM | POA: Diagnosis not present

## 2023-07-11 DIAGNOSIS — I3139 Other pericardial effusion (noninflammatory): Secondary | ICD-10-CM | POA: Diagnosis not present

## 2023-07-11 DIAGNOSIS — I10 Essential (primary) hypertension: Secondary | ICD-10-CM | POA: Diagnosis not present

## 2023-07-11 DIAGNOSIS — J9601 Acute respiratory failure with hypoxia: Secondary | ICD-10-CM | POA: Diagnosis not present

## 2023-07-11 DIAGNOSIS — J90 Pleural effusion, not elsewhere classified: Secondary | ICD-10-CM | POA: Diagnosis not present

## 2023-07-11 DIAGNOSIS — R918 Other nonspecific abnormal finding of lung field: Secondary | ICD-10-CM | POA: Diagnosis not present

## 2023-07-11 DIAGNOSIS — I517 Cardiomegaly: Secondary | ICD-10-CM | POA: Diagnosis not present

## 2023-07-11 DIAGNOSIS — R0902 Hypoxemia: Secondary | ICD-10-CM | POA: Diagnosis not present

## 2023-07-11 DIAGNOSIS — R7303 Prediabetes: Secondary | ICD-10-CM | POA: Diagnosis not present

## 2023-07-12 DIAGNOSIS — J9601 Acute respiratory failure with hypoxia: Secondary | ICD-10-CM | POA: Diagnosis not present

## 2023-07-12 DIAGNOSIS — J90 Pleural effusion, not elsewhere classified: Secondary | ICD-10-CM | POA: Diagnosis not present

## 2023-07-12 DIAGNOSIS — I517 Cardiomegaly: Secondary | ICD-10-CM | POA: Diagnosis not present

## 2023-07-12 DIAGNOSIS — R7303 Prediabetes: Secondary | ICD-10-CM | POA: Diagnosis not present

## 2023-07-12 DIAGNOSIS — I10 Essential (primary) hypertension: Secondary | ICD-10-CM | POA: Diagnosis not present

## 2023-07-12 DIAGNOSIS — I3139 Other pericardial effusion (noninflammatory): Secondary | ICD-10-CM | POA: Diagnosis not present

## 2023-07-12 DIAGNOSIS — Z87891 Personal history of nicotine dependence: Secondary | ICD-10-CM | POA: Diagnosis not present

## 2023-07-12 DIAGNOSIS — J441 Chronic obstructive pulmonary disease with (acute) exacerbation: Secondary | ICD-10-CM | POA: Diagnosis not present

## 2023-07-13 DIAGNOSIS — J441 Chronic obstructive pulmonary disease with (acute) exacerbation: Secondary | ICD-10-CM | POA: Diagnosis not present

## 2023-07-13 DIAGNOSIS — R7303 Prediabetes: Secondary | ICD-10-CM | POA: Diagnosis not present

## 2023-07-13 DIAGNOSIS — R0902 Hypoxemia: Secondary | ICD-10-CM | POA: Diagnosis not present

## 2023-07-13 DIAGNOSIS — I10 Essential (primary) hypertension: Secondary | ICD-10-CM | POA: Diagnosis not present

## 2023-07-13 DIAGNOSIS — I3139 Other pericardial effusion (noninflammatory): Secondary | ICD-10-CM | POA: Diagnosis not present

## 2023-07-13 DIAGNOSIS — J9601 Acute respiratory failure with hypoxia: Secondary | ICD-10-CM | POA: Diagnosis not present

## 2023-07-13 DIAGNOSIS — J90 Pleural effusion, not elsewhere classified: Secondary | ICD-10-CM | POA: Diagnosis not present

## 2023-07-13 DIAGNOSIS — Z87891 Personal history of nicotine dependence: Secondary | ICD-10-CM | POA: Diagnosis not present

## 2023-07-13 DIAGNOSIS — I517 Cardiomegaly: Secondary | ICD-10-CM | POA: Diagnosis not present

## 2023-07-14 DIAGNOSIS — Z87891 Personal history of nicotine dependence: Secondary | ICD-10-CM | POA: Diagnosis not present

## 2023-07-14 DIAGNOSIS — R7303 Prediabetes: Secondary | ICD-10-CM | POA: Diagnosis not present

## 2023-07-14 DIAGNOSIS — I517 Cardiomegaly: Secondary | ICD-10-CM | POA: Diagnosis not present

## 2023-07-14 DIAGNOSIS — J90 Pleural effusion, not elsewhere classified: Secondary | ICD-10-CM | POA: Diagnosis not present

## 2023-07-14 DIAGNOSIS — I3139 Other pericardial effusion (noninflammatory): Secondary | ICD-10-CM | POA: Diagnosis not present

## 2023-07-14 DIAGNOSIS — J441 Chronic obstructive pulmonary disease with (acute) exacerbation: Secondary | ICD-10-CM | POA: Diagnosis not present

## 2023-07-14 DIAGNOSIS — I10 Essential (primary) hypertension: Secondary | ICD-10-CM | POA: Diagnosis not present

## 2023-07-14 DIAGNOSIS — J9601 Acute respiratory failure with hypoxia: Secondary | ICD-10-CM | POA: Diagnosis not present

## 2023-07-15 DIAGNOSIS — R0600 Dyspnea, unspecified: Secondary | ICD-10-CM | POA: Diagnosis not present

## 2023-07-15 DIAGNOSIS — I517 Cardiomegaly: Secondary | ICD-10-CM | POA: Diagnosis not present

## 2023-07-15 DIAGNOSIS — R918 Other nonspecific abnormal finding of lung field: Secondary | ICD-10-CM | POA: Diagnosis not present

## 2023-07-20 ENCOUNTER — Inpatient Hospital Stay (HOSPITAL_COMMUNITY)
Admission: EM | Admit: 2023-07-20 | Discharge: 2023-07-26 | DRG: 314 | Disposition: A | Discharge status: Home Health | Payer: 59 | Attending: Internal Medicine | Admitting: Internal Medicine

## 2023-07-20 ENCOUNTER — Encounter (HOSPITAL_COMMUNITY): Payer: Self-pay

## 2023-07-20 ENCOUNTER — Emergency Department (HOSPITAL_COMMUNITY): Payer: 59

## 2023-07-20 ENCOUNTER — Inpatient Hospital Stay: Admit: 2023-07-20 | Payer: 59 | Admitting: Cardiology

## 2023-07-20 ENCOUNTER — Other Ambulatory Visit: Payer: Self-pay

## 2023-07-20 DIAGNOSIS — R000 Tachycardia, unspecified: Secondary | ICD-10-CM | POA: Diagnosis present

## 2023-07-20 DIAGNOSIS — J9601 Acute respiratory failure with hypoxia: Secondary | ICD-10-CM | POA: Diagnosis present

## 2023-07-20 DIAGNOSIS — N183 Chronic kidney disease, stage 3 unspecified: Secondary | ICD-10-CM | POA: Diagnosis present

## 2023-07-20 DIAGNOSIS — Z8042 Family history of malignant neoplasm of prostate: Secondary | ICD-10-CM

## 2023-07-20 DIAGNOSIS — R079 Chest pain, unspecified: Secondary | ICD-10-CM | POA: Diagnosis not present

## 2023-07-20 DIAGNOSIS — I3139 Other pericardial effusion (noninflammatory): Secondary | ICD-10-CM | POA: Diagnosis not present

## 2023-07-20 DIAGNOSIS — R188 Other ascites: Secondary | ICD-10-CM | POA: Diagnosis present

## 2023-07-20 DIAGNOSIS — Z6838 Body mass index (BMI) 38.0-38.9, adult: Secondary | ICD-10-CM | POA: Diagnosis not present

## 2023-07-20 DIAGNOSIS — J9811 Atelectasis: Secondary | ICD-10-CM | POA: Diagnosis present

## 2023-07-20 DIAGNOSIS — J449 Chronic obstructive pulmonary disease, unspecified: Secondary | ICD-10-CM | POA: Diagnosis not present

## 2023-07-20 DIAGNOSIS — I493 Ventricular premature depolarization: Secondary | ICD-10-CM | POA: Diagnosis present

## 2023-07-20 DIAGNOSIS — R0602 Shortness of breath: Principal | ICD-10-CM

## 2023-07-20 DIAGNOSIS — Z7984 Long term (current) use of oral hypoglycemic drugs: Secondary | ICD-10-CM

## 2023-07-20 DIAGNOSIS — G4730 Sleep apnea, unspecified: Secondary | ICD-10-CM | POA: Diagnosis not present

## 2023-07-20 DIAGNOSIS — I959 Hypotension, unspecified: Secondary | ICD-10-CM | POA: Diagnosis present

## 2023-07-20 DIAGNOSIS — K5900 Constipation, unspecified: Secondary | ICD-10-CM | POA: Diagnosis present

## 2023-07-20 DIAGNOSIS — Z79899 Other long term (current) drug therapy: Secondary | ICD-10-CM | POA: Diagnosis not present

## 2023-07-20 DIAGNOSIS — R590 Localized enlarged lymph nodes: Secondary | ICD-10-CM | POA: Diagnosis not present

## 2023-07-20 DIAGNOSIS — N189 Chronic kidney disease, unspecified: Secondary | ICD-10-CM | POA: Diagnosis not present

## 2023-07-20 DIAGNOSIS — I517 Cardiomegaly: Secondary | ICD-10-CM | POA: Diagnosis not present

## 2023-07-20 DIAGNOSIS — N179 Acute kidney failure, unspecified: Secondary | ICD-10-CM | POA: Diagnosis present

## 2023-07-20 DIAGNOSIS — R531 Weakness: Secondary | ICD-10-CM | POA: Diagnosis not present

## 2023-07-20 DIAGNOSIS — D3501 Benign neoplasm of right adrenal gland: Secondary | ICD-10-CM | POA: Diagnosis not present

## 2023-07-20 DIAGNOSIS — D3502 Benign neoplasm of left adrenal gland: Secondary | ICD-10-CM | POA: Diagnosis not present

## 2023-07-20 DIAGNOSIS — I700 Atherosclerosis of aorta: Secondary | ICD-10-CM | POA: Diagnosis not present

## 2023-07-20 DIAGNOSIS — I5031 Acute diastolic (congestive) heart failure: Secondary | ICD-10-CM | POA: Diagnosis not present

## 2023-07-20 DIAGNOSIS — I69354 Hemiplegia and hemiparesis following cerebral infarction affecting left non-dominant side: Secondary | ICD-10-CM | POA: Diagnosis not present

## 2023-07-20 DIAGNOSIS — I130 Hypertensive heart and chronic kidney disease with heart failure and stage 1 through stage 4 chronic kidney disease, or unspecified chronic kidney disease: Secondary | ICD-10-CM | POA: Diagnosis present

## 2023-07-20 DIAGNOSIS — Z8701 Personal history of pneumonia (recurrent): Secondary | ICD-10-CM | POA: Diagnosis not present

## 2023-07-20 DIAGNOSIS — I309 Acute pericarditis, unspecified: Principal | ICD-10-CM | POA: Diagnosis present

## 2023-07-20 DIAGNOSIS — N1831 Chronic kidney disease, stage 3a: Secondary | ICD-10-CM | POA: Diagnosis present

## 2023-07-20 DIAGNOSIS — R918 Other nonspecific abnormal finding of lung field: Secondary | ICD-10-CM | POA: Diagnosis not present

## 2023-07-20 DIAGNOSIS — E119 Type 2 diabetes mellitus without complications: Secondary | ICD-10-CM | POA: Diagnosis not present

## 2023-07-20 DIAGNOSIS — G8929 Other chronic pain: Secondary | ICD-10-CM | POA: Diagnosis present

## 2023-07-20 DIAGNOSIS — R0989 Other specified symptoms and signs involving the circulatory and respiratory systems: Secondary | ICD-10-CM | POA: Diagnosis not present

## 2023-07-20 DIAGNOSIS — Z87891 Personal history of nicotine dependence: Secondary | ICD-10-CM | POA: Diagnosis not present

## 2023-07-20 DIAGNOSIS — R0789 Other chest pain: Secondary | ICD-10-CM | POA: Diagnosis not present

## 2023-07-20 DIAGNOSIS — I10 Essential (primary) hypertension: Secondary | ICD-10-CM | POA: Diagnosis not present

## 2023-07-20 DIAGNOSIS — E662 Morbid (severe) obesity with alveolar hypoventilation: Secondary | ICD-10-CM | POA: Diagnosis present

## 2023-07-20 DIAGNOSIS — J441 Chronic obstructive pulmonary disease with (acute) exacerbation: Secondary | ICD-10-CM | POA: Diagnosis present

## 2023-07-20 DIAGNOSIS — I5033 Acute on chronic diastolic (congestive) heart failure: Secondary | ICD-10-CM | POA: Diagnosis present

## 2023-07-20 DIAGNOSIS — Z9981 Dependence on supplemental oxygen: Secondary | ICD-10-CM | POA: Diagnosis not present

## 2023-07-20 DIAGNOSIS — E66812 Obesity, class 2: Secondary | ICD-10-CM

## 2023-07-20 DIAGNOSIS — R0600 Dyspnea, unspecified: Secondary | ICD-10-CM | POA: Diagnosis not present

## 2023-07-20 LAB — ECHOCARDIOGRAM COMPLETE
Area-P 1/2: 4.8 cm2
S' Lateral: 2.7 cm

## 2023-07-20 LAB — CBC
HCT: 35.9 % — ABNORMAL LOW (ref 39.0–52.0)
Hemoglobin: 11.2 g/dL — ABNORMAL LOW (ref 13.0–17.0)
MCH: 25.9 pg — ABNORMAL LOW (ref 26.0–34.0)
MCHC: 31.2 g/dL (ref 30.0–36.0)
MCV: 83.1 fL (ref 80.0–100.0)
Platelets: 490 10*3/uL — ABNORMAL HIGH (ref 150–400)
RBC: 4.32 MIL/uL (ref 4.22–5.81)
RDW: 16.2 % — ABNORMAL HIGH (ref 11.5–15.5)
WBC: 19 10*3/uL — ABNORMAL HIGH (ref 4.0–10.5)
nRBC: 0 % (ref 0.0–0.2)

## 2023-07-20 LAB — HIV ANTIBODY (ROUTINE TESTING W REFLEX): HIV Screen 4th Generation wRfx: NONREACTIVE

## 2023-07-20 LAB — CREATININE, SERUM
Creatinine, Ser: 1.32 mg/dL — ABNORMAL HIGH (ref 0.61–1.24)
GFR, Estimated: >60 mL/min (ref >60)

## 2023-07-20 MED ORDER — COLCHICINE 0.6 MG PO TABS
0.6000 mg | ORAL_TABLET | Freq: Two times a day (BID) | ORAL | Status: DC
  Administered 2023-07-20 – 2023-07-26 (×12): 0.6 mg via ORAL
  Filled 2023-07-20 (×13): qty 1

## 2023-07-20 MED ORDER — SODIUM CHLORIDE 0.9 % IV SOLN: 250.0000 mL | INTRAVENOUS | Status: DC | PRN

## 2023-07-20 MED ORDER — SODIUM CHLORIDE 0.9% FLUSH
3.0000 mL | Freq: Two times a day (BID) | INTRAVENOUS | Status: DC
  Administered 2023-07-21 – 2023-07-23 (×4): 3 mL via INTRAVENOUS

## 2023-07-20 MED ORDER — HEPARIN SODIUM (PORCINE) 5000 UNIT/ML IJ SOLN
5000.0000 [IU] | Freq: Three times a day (TID) | INTRAMUSCULAR | Status: DC
  Administered 2023-07-20 – 2023-07-26 (×18): 5000 [IU] via SUBCUTANEOUS
  Filled 2023-07-20 (×19): qty 1

## 2023-07-20 MED ORDER — OXYCODONE-ACETAMINOPHEN 5-325 MG PO TABS
1.0000 | ORAL_TABLET | Freq: Four times a day (QID) | ORAL | Status: DC | PRN
  Filled 2023-07-20: qty 1

## 2023-07-20 MED ORDER — OXYCODONE-ACETAMINOPHEN 5-325 MG PO TABS: 2.0000 | ORAL_TABLET | Freq: Four times a day (QID) | ORAL | Status: DC | PRN

## 2023-07-20 MED ORDER — FUROSEMIDE 10 MG/ML IJ SOLN
40.0000 mg | Freq: Two times a day (BID) | INTRAMUSCULAR | Status: DC
  Administered 2023-07-20 – 2023-07-21 (×2): 40 mg via INTRAVENOUS
  Filled 2023-07-20 (×2): qty 4

## 2023-07-20 MED ORDER — SODIUM CHLORIDE 0.9% FLUSH
3.0000 mL | Freq: Two times a day (BID) | INTRAVENOUS | Status: DC
  Administered 2023-07-20 – 2023-07-23 (×4): 3 mL via INTRAVENOUS

## 2023-07-20 MED ORDER — OXYCODONE-ACETAMINOPHEN 5-325 MG PO TABS
2.0000 | ORAL_TABLET | Freq: Four times a day (QID) | ORAL | Status: DC | PRN
  Administered 2023-07-20 – 2023-07-26 (×16): 2 via ORAL
  Filled 2023-07-20 (×16): qty 2

## 2023-07-20 MED ORDER — IOHEXOL 350 MG/ML SOLN
75.0000 mL | Freq: Once | INTRAVENOUS | Status: AC | PRN
  Administered 2023-07-20: 75 mL via INTRAVENOUS

## 2023-07-20 MED ORDER — AZITHROMYCIN 250 MG PO TABS
500.0000 mg | ORAL_TABLET | Freq: Every day | ORAL | Status: DC
  Administered 2023-07-20 – 2023-07-21 (×2): 500 mg via ORAL
  Filled 2023-07-20 (×3): qty 2

## 2023-07-20 MED ORDER — SODIUM CHLORIDE 0.9% FLUSH: 3.0000 mL | INTRAVENOUS | Status: DC | PRN

## 2023-07-20 MED ORDER — OXYCODONE HCL 5 MG PO TABS
5.0000 mg | ORAL_TABLET | Freq: Four times a day (QID) | ORAL | Status: DC | PRN
  Filled 2023-07-20: qty 1

## 2023-07-20 MED ORDER — ALBUTEROL SULFATE (2.5 MG/3ML) 0.083% IN NEBU
2.5000 mg | INHALATION_SOLUTION | RESPIRATORY_TRACT | Status: DC | PRN
  Filled 2023-07-20: qty 3

## 2023-07-20 MED ORDER — OXYCODONE-ACETAMINOPHEN 10-325 MG PO TABS: 1.0000 | ORAL_TABLET | Freq: Four times a day (QID) | ORAL | Status: DC | PRN

## 2023-07-20 MED ORDER — PREDNISONE 20 MG PO TABS
40.0000 mg | ORAL_TABLET | Freq: Every day | ORAL | Status: DC
  Administered 2023-07-20 – 2023-07-21 (×2): 40 mg via ORAL
  Filled 2023-07-20 (×2): qty 2

## 2023-07-20 MED ORDER — UMECLIDINIUM BROMIDE 62.5 MCG/ACT IN AEPB
1.0000 | INHALATION_SPRAY | Freq: Every day | RESPIRATORY_TRACT | Status: DC
  Filled 2023-07-20: qty 7

## 2023-07-20 NOTE — Progress Notes (Signed)
Patient refused Cpap . States he does not tolerate and is good with his O2 at night
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
## 2023-07-20 NOTE — Care Management (Signed)
Patient just recently set up with [facility] and home health with [facility] when he was at [facility].

## 2023-07-20 NOTE — ED Provider Notes (Signed)
  Physical Exam  BP 114/79 (BP Location: Left Arm)   Pulse 95   Resp 18   SpO2 95%   Procedures  Procedures  ED Course / MDM   Clinical Course as of 07/20/23 1536  Sun Jul 20, 2023  1403 Spoke with hospitalist, requests waiting until after PE study and reconsulting [AS]  1532 S. Recent admission to OSH with new O2 requirement, increasing SOB today on 4L. Likely hx COPD/asthma. Has known pericardial effusion - cards following (Dr. Jimmey Ralph). Got echo today. Getting CTA PE study. Hospitalist Willette Pa) following as well. Labs in chart. F/u CT, admit [KM]    Clinical Course User Index [AS] Schutt, Edsel Petrin, PA-C [KM] Lyman Speller, MD   Medical Decision Making Amount and/or Complexity of Data Reviewed Radiology: ordered.  Risk Prescription drug management. Decision regarding hospitalization.   Signout taken from off, ED team.  In brief, this is a 61 year old male with past medical history of hypertension, asthma, COPD, chronic supplemental oxygen use presenting as transfer from [facility] for pericardial effusion.  Patient was admitted to [facility] 3 days ago but return to the emergency department today with worsening shortness of breath.  During today's visit he was found to have pericardial effusion accompanied by tachycardia and hypotension.  Cardiology here at [facility] was then engaged patient was transferred for evaluation.  Patient's laboratory workup from earlier today prior to transfer reviewed.  White blood cell count was 17.1 with a neutrophilic predominance.  Metabolic panel had a creatinine of 1.22.  BNP was mildly elevated at 534.  D-dimer was also markedly positive, therefore patient will be undergoing CTA PE study.  This study was reviewed and was negative for pulmonary emboli.  Pleural effusion was again visualized.  Patient has also undergone echocardiogram while in the emergency department.  Moderately sized pericardial effusion was visualized.  Cardiology is not  planning for urgent intervention.  Cardiology has recommended medicine admission for heart failure and COPD exacerbations and they will continue to follow as an inpatient.  I discussed with hospitalist team for admission.       Lyman Speller, MD 07/20/23 Barnie Mort    Eber Hong, MD 07/21/23 [phone]
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
## 2023-07-20 NOTE — ED Notes (Signed)
Pt transported to CT by this RN and EMT-P Matt.

## 2023-07-20 NOTE — Progress Notes (Signed)
  Echocardiogram 2D Echocardiogram has been performed.  Delcie Roch 07/20/2023, 2:26 PM

## 2023-07-20 NOTE — Progress Notes (Signed)
[facility] night cross cover note:   I was notified by RN of the patient's request for resumption of home Percocet 10/325 mg p.o. every 6 hours as needed for chronic pain.  I subsequently ordered Percocet, but had a slightly lower dose than home.  This was done in the context of his presenting acute COPD exacerbation.  Specifically, I have ordered Percocet 5/325 mg p.o. every 6 hours as needed.     Newton Pigg, DO Hospitalist

## 2023-07-20 NOTE — ED Triage Notes (Signed)
Patient BIB [facility] from [facility] this evening due to pericardial effusion. (LARGE). Patient states he was discharged for this matter about 3 days ago but nothing was ever done. He is on 3L at home but in route due to air hunger he is on 5L and stating at 96%. Coughing up thick phelm. Patient received Lovenox this morning at [facility]. Does have history of past stroke and has issues with left sided weakness. Patient is alert and aware x4.

## 2023-07-20 NOTE — ED Provider Notes (Signed)
[place] EMERGENCY DEPARTMENT AT [facility] Provider Note   CSN: [id number] Arrival date & time: 07/20/23  1303     History  Chief Complaint  Patient presents with  . Pericardial Effusion    [name] is a 62 y.o. male.  With history of hypertension, arthritis, asthma, chronic supplemental oxygen use presenting to the ED via EMS from [facility] for admission to [facility] for evaluation of a pericardial effusion.  He states he has been feeling some chest pain and shortness of breath for the past couple weeks.  He was seen in the emergency department at [facility] 3 days ago and states he was discharged with an albuterol inhaler.  He then returned to the emergency department today and was found to have a pericardial effusion, tachycardia and hypotension.  [facility] was consulted, ED provider at [facility] spoke with Dr. Jimmey Ralph who recommended patient come to [facility] for admission.  There is some confusion on whether this will be a direct cardiology admission or ED to ED transfer with hospitalist admission.  He denies chest pain currently.  He does have a cough productive of yellow to brown sputum.  He is not on anticoagulation chronically.  He states his albuterol inhaler is not helping.  He reports a history of smoking, states he stopped 15 years ago.  HPI     Home Medications Prior to Admission medications   Medication Sig Start Date End Date Taking? Authorizing Provider  amLODipine (NORVASC) 5 MG tablet Take 1 tablet (5 mg total) by mouth daily. 07/09/21   Gailen Shelter, PA  Meloxicam 10 MG CAPS Take 10 mg by mouth daily.    [provider]  methocarbamol (ROBAXIN) 500 MG tablet Take 1 tablet (500 mg total) by mouth 2 (two) times daily. 07/09/21   Gailen Shelter, PA  olmesartan-hydrochlorothiazide (BENICAR HCT) 40-12.5 MG tablet Take 1 tablet by mouth daily. 12/19/20   [provider]  oxyCODONE-acetaminophen  (PERCOCET) 10-325 MG tablet Take 1 tablet by mouth every 4 (four) hours. 04/07/21   [provider]      Allergies    Patient has no known allergies.    Review of Systems   Review of Systems  Respiratory:  Positive for cough and shortness of breath.   Cardiovascular:  Positive for chest pain.  All other systems reviewed and are negative.   Physical Exam Updated Vital Signs BP 114/79 (BP Location: Left Arm)   Pulse 95   Resp 18   SpO2 95%  Physical Exam Vitals and nursing note reviewed.  Constitutional:      General: He is in acute distress.     Appearance: Normal appearance. He is normal weight.  HENT:     Head: Normocephalic and atraumatic.  Neck:     Comments: No JVD Cardiovascular:     Rate and Rhythm: Normal rate and regular rhythm.  Pulmonary:     Effort: Pulmonary effort is normal. No respiratory distress.     Breath sounds: Wheezing (Faint end expiratory) present.  Abdominal:     General: Abdomen is flat.  Musculoskeletal:        General: Normal range of motion.     Cervical back: Neck supple.     Right lower leg: No edema.     Left lower leg: No edema.  Skin:    General: Skin is warm and dry.  Neurological:     Mental Status: He is alert  and oriented to person, place, and time.  Psychiatric:        Mood and Affect: Mood normal.        Behavior: Behavior normal.     ED Results / Procedures / Treatments   Labs (all labs ordered are listed, but only abnormal results are displayed) Labs Reviewed - No data to display  EKG None  Radiology ECHOCARDIOGRAM COMPLETE  Result Date: 07/20/2023    ECHOCARDIOGRAM REPORT   Patient Name:   [name] Date of Exam: 07/20/2023 Medical Rec #:  [id number]       Height:       66.0 in Accession #:    [id number]      Weight:       240.0 lb Date of Birth:  1961/03/21       BSA:          2.161 m Patient Age:    61 years        BP:           114/79 mmHg Patient Gender: M               HR:           96 bpm. Exam  Location:  Inpatient Procedure: 2D Echo, Cardiac Doppler and Color Doppler STAT ECHO Indications:    pericardial effusion  History:        Patient has prior history of Echocardiogram examinations. Risk                 Factors:Sleep Apnea and Hypertension.  Sonographer:    Delcie Roch RDCS Referring Phys: [id number] DAYNA N DUNN IMPRESSIONS  1. Left ventricular ejection fraction, by estimation, is 60 to 65%. The left ventricle has normal function. The left ventricle has no regional wall motion abnormalities. There is moderate concentric left ventricular hypertrophy. Left ventricular diastolic parameters are indeterminate.  2. Right ventricular systolic function is normal. The right ventricular size is normal. Tricuspid regurgitation signal is inadequate for assessing PA pressure.  3. Moderate to large. The pericardial effusion is circumferential. There is no evidence of cardiac tamponade.  4. The mitral valve is normal in structure. No evidence of mitral valve regurgitation. No evidence of mitral stenosis.  5. The aortic valve has an indeterminant number of cusps. Aortic valve regurgitation is not visualized. No aortic stenosis is present.  6. The inferior vena cava is normal in size with greater than 50% respiratory variability, suggesting right atrial pressure of 3 mmHg. FINDINGS  Left Ventricle: Left ventricular ejection fraction, by estimation, is 60 to 65%. The left ventricle has normal function. The left ventricle has no regional wall motion abnormalities. The left ventricular internal cavity size was normal in size. There is  moderate concentric left ventricular hypertrophy. Left ventricular diastolic parameters are indeterminate. Right Ventricle: The right ventricular size is normal. No increase in right ventricular wall thickness. Right ventricular systolic function is normal. Tricuspid regurgitation signal is inadequate for assessing PA pressure. Left Atrium: Left atrial size was normal in size. Right  Atrium: Right atrial size was normal in size. Pericardium: Moderate to large. The pericardial effusion is circumferential. There is no evidence of cardiac tamponade. Presence of epicardial fat layer. Mitral Valve: The mitral valve is normal in structure. No evidence of mitral valve regurgitation. No evidence of mitral valve stenosis. Tricuspid Valve: The tricuspid valve is normal in structure. Tricuspid valve regurgitation is not demonstrated. No evidence of tricuspid stenosis. Aortic Valve: The aortic valve has an  indeterminant number of cusps. Aortic valve regurgitation is not visualized. No aortic stenosis is present. Pulmonic Valve: The pulmonic valve was not well visualized. Pulmonic valve regurgitation is not visualized. No evidence of pulmonic stenosis. Aorta: The aortic root is normal in size and structure. Venous: The inferior vena cava is normal in size with greater than 50% respiratory variability, suggesting right atrial pressure of 3 mmHg. IAS/Shunts: No atrial level shunt detected by color flow Doppler.  LEFT VENTRICLE PLAX 2D LVIDd:         4.50 cm Diastology LVIDs:         2.70 cm LV e' medial:    6.09 cm/s LV PW:         1.30 cm LV E/e' medial:  11.4 LV IVS:        1.60 cm LV e' lateral:   5.87 cm/s                        LV E/e' lateral: 11.9  RIGHT VENTRICLE             IVC RV Basal diam:  2.60 cm     IVC diam: 2.20 cm RV S prime:     11.70 cm/s TAPSE (M-mode): 1.2 cm LEFT ATRIUM           Index        RIGHT ATRIUM           Index LA diam:      4.00 cm 1.85 cm/m   RA Area:     22.00 cm LA Vol (A2C): 53.1 ml 24.57 ml/m  RA Volume:   65.00 ml  30.08 ml/m  AORTIC VALVE LVOT Vmax:   105.00 cm/s LVOT Vmean:  69.400 cm/s LVOT VTI:    0.180 m  AORTA Ao Asc diam: 3.10 cm MITRAL VALVE MV Area (PHT): 4.80 cm    SHUNTS MV Decel Time: 158 msec    Systemic VTI: 0.18 m MV E velocity: 69.70 cm/s MV A velocity: 91.30 cm/s MV E/A ratio:  0.76 Kardie Tobb DO Electronically signed by Thomasene Ripple DO Signature  Date/Time: 07/20/2023/3:03:53 PM    Final     Procedures Procedures    Medications Ordered in ED Medications - No data to display  ED Course/ Medical Decision Making/ A&P Clinical Course as of 07/20/23 1536  Sun Jul 20, 2023  1403 Spoke with hospitalist, requests waiting until after PE study and reconsulting [AS]  1532 S. Recent admission to OSH with new O2 requirement, increasing SOB today on 4L. Likely hx COPD/asthma. Has known pericardial effusion - cards following (Dr. Jimmey Ralph). Got echo today. Getting CTA PE study. Hospitalist Willette Pa) following as well. Labs in chart. F/u CT, admit [KM]    Clinical Course User Index [AS] Lyra Alaimo, Edsel Petrin, PA-C [KM] Lyman Speller, MD                                 Medical Decision Making Amount and/or Complexity of Data Reviewed Radiology: ordered.   This patient presents to the ED for concern of chest pain, shortness of breath, this involves an extensive number of treatment options, and is a complaint that carries with it a high risk of complications and morbidity.  The emergent differential diagnosis for shortness of breath includes, but is not limited to, Pulmonary edema, bronchoconstriction, Pneumonia, Pulmonary embolism, Pneumotherax/ Hemothorax, Dysrythmia, ACS.    My initial workup  includes CT PE study.  Much of the workup was performed at [facility] prior to arrival  Additional history obtained from: Nursing notes from this visit. Previous records within EMR system ED visit at [facility] just prior to arrival.  Patient is a transfer from this ED.  Transferred here for cardiology consultation for potential pericardiocentesis due to large pericardial effusion on CT chest [facility] provides a portion of the history  I reviewed and interpreted labs which include: CBC, BNP, troponin, D-dimer, CMP, magnesium, ABG.  Leukocytosis of 17.1.  No anemia.  BNP elevated to 534.  Initial and delta troponin negative.  CMP reassuring, ALT  elevated to 108, alk phos elevated to 125.  Magnesium normal.  D-dimer elevated  I ordered imaging studies including CT PE study.  CT chest Non[facility] was ordered at [facility] which showed large pericardial effusion I independently visualized and interpreted imaging which showed pending at shift change  Cardiac Monitoring:  .The patient was maintained on a cardiac monitor.  I personally viewed and interpreted the cardiac monitored which showed an underlying rhythm of: NSR  Afebrile, Hypoxic on room air, not hypoxic on 4 L nasal cannula.  62 year old male presenting to the ED for evaluation of chest pain and shortness of breath.  This has been ongoing for quite some time now.  He was seen at [facility] earlier today.  Lanier Prude EDP consulted [facility] cardiology who recommended CT of the chest and transfer to [facility] for consultation.  On chart review, it appears that a PE study was not performed but rather a noncontrast CT of the chest.  Unclear reasoning as he denies any contrast allergies or history of CKD.  Cardiology is aware of the patient and evaluated the patient at bedside on arrival to the emergency department.  The recommendation is to obtain CT PE study and echocardiogram and to determine disposition after this.  Will likely need admission to hospitalist for continued medical management with cardiology to follow.  Cardiology stated they will order a dose of diuretic.  Plan at the time to change is pending CT PE study.  If significant right heart strain, may need ICU admission.  If negative for right heart strain, patient is likely stable for admission to hospitalist service for continued medical management. Care will be handed off to oncoming provider pending CT PE study. Please see their note for final disposition and decision making. Plan may change at the discretion of the oncoming provider.  Note: Portions of this report may have been transcribed using voice recognition  software. Every effort was made to ensure accuracy; however, inadvertent computerized transcription errors may still be present.         Final Clinical Impression(s) / ED Diagnoses Final diagnoses:  Shortness of breath    Rx / DC Orders ED Discharge Orders     None         Michelle Piper, PA-C 07/20/23 1536    Elayne Snare K, DO 07/20/23 1603
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
## 2023-07-21 ENCOUNTER — Inpatient Hospital Stay (HOSPITAL_COMMUNITY): Payer: 59

## 2023-07-21 DIAGNOSIS — I5033 Acute on chronic diastolic (congestive) heart failure: Secondary | ICD-10-CM | POA: Diagnosis not present

## 2023-07-21 DIAGNOSIS — N189 Chronic kidney disease, unspecified: Secondary | ICD-10-CM

## 2023-07-21 DIAGNOSIS — J441 Chronic obstructive pulmonary disease with (acute) exacerbation: Secondary | ICD-10-CM

## 2023-07-21 DIAGNOSIS — E66812 Obesity, class 2: Secondary | ICD-10-CM

## 2023-07-21 DIAGNOSIS — G4730 Sleep apnea, unspecified: Secondary | ICD-10-CM | POA: Diagnosis not present

## 2023-07-21 DIAGNOSIS — I3139 Other pericardial effusion (noninflammatory): Secondary | ICD-10-CM | POA: Diagnosis not present

## 2023-07-21 DIAGNOSIS — I5031 Acute diastolic (congestive) heart failure: Secondary | ICD-10-CM | POA: Diagnosis not present

## 2023-07-21 DIAGNOSIS — N179 Acute kidney failure, unspecified: Secondary | ICD-10-CM | POA: Diagnosis not present

## 2023-07-21 LAB — BASIC METABOLIC PANEL
Anion gap: 10 (ref 5–15)
BUN: 25 mg/dL — ABNORMAL HIGH (ref 6–20)
CO2: 30 mmol/L (ref 22–32)
Calcium: 8.3 mg/dL — ABNORMAL LOW (ref 8.9–10.3)
Chloride: 97 mmol/L — ABNORMAL LOW (ref 98–111)
Creatinine, Ser: 1.23 mg/dL (ref 0.61–1.24)
GFR, Estimated: >60 mL/min (ref >60)
Glucose, Bld: 155 mg/dL — ABNORMAL HIGH (ref 70–99)
Potassium: 4.3 mmol/L (ref 3.5–5.1)
Sodium: 137 mmol/L (ref 135–145)

## 2023-07-21 LAB — BRAIN NATRIURETIC PEPTIDE: B Natriuretic Peptide: 120.9 pg/mL — ABNORMAL HIGH (ref 0.0–100.0)

## 2023-07-21 LAB — MAGNESIUM: Magnesium: 2 mg/dL (ref 1.7–2.4)

## 2023-07-21 MED ORDER — FUROSEMIDE 40 MG PO TABS
40.0000 mg | ORAL_TABLET | ORAL | Status: AC
  Administered 2023-07-22: 40 mg via ORAL
  Filled 2023-07-21: qty 1

## 2023-07-21 MED ORDER — MOMETASONE FURO-FORMOTEROL FUM 100-5 MCG/ACT IN AERO
2.0000 | INHALATION_SPRAY | Freq: Two times a day (BID) | RESPIRATORY_TRACT | Status: DC
  Administered 2023-07-22 – 2023-07-26 (×9): 2 via RESPIRATORY_TRACT
  Filled 2023-07-21: qty 8.8

## 2023-07-21 MED ORDER — GUAIFENESIN-DM 100-10 MG/5ML PO SYRP
5.0000 mL | ORAL_SOLUTION | ORAL | Status: DC | PRN
  Administered 2023-07-23: 5 mL via ORAL
  Filled 2023-07-21: qty 5

## 2023-07-21 MED ORDER — METHYLPREDNISOLONE SODIUM SUCC 40 MG IJ SOLR
40.0000 mg | Freq: Two times a day (BID) | INTRAMUSCULAR | Status: DC
  Administered 2023-07-21 – 2023-07-23 (×4): 40 mg via INTRAVENOUS
  Filled 2023-07-21 (×4): qty 1

## 2023-07-21 MED ORDER — UMECLIDINIUM BROMIDE 62.5 MCG/ACT IN AEPB
1.0000 | INHALATION_SPRAY | Freq: Every day | RESPIRATORY_TRACT | Status: DC
  Administered 2023-07-21 – 2023-07-26 (×6): 1 via RESPIRATORY_TRACT
  Filled 2023-07-21: qty 7

## 2023-07-21 MED ORDER — IPRATROPIUM-ALBUTEROL 0.5-2.5 (3) MG/3ML IN SOLN
3.0000 mL | Freq: Four times a day (QID) | RESPIRATORY_TRACT | Status: DC
  Administered 2023-07-21 – 2023-07-26 (×19): 3 mL via RESPIRATORY_TRACT
  Filled 2023-07-21 (×19): qty 3

## 2023-07-21 MED ORDER — IPRATROPIUM-ALBUTEROL 0.5-2.5 (3) MG/3ML IN SOLN: 3.0000 mL | RESPIRATORY_TRACT | Status: DC | PRN

## 2023-07-21 NOTE — Evaluation (Signed)
Physical Therapy Evaluation Patient Details Name: [name] MRN: [id number] DOB: 12-19-1960 Today's Date: 07/21/2023  History of Present Illness  62 y/o male transferred from [facility] to [facility] 07/20/23 after found to be tachycardic hypotensive with pericardial effusion. Prior admit 3 days prior to [facility] w/ pericardial effusion. PMHx: HFpEF, pericardial effusion, COPD, sleep apnea on chronic supplemental O2 (3L), HTN, arthritis, CKD, prior CVA w/ left sided weakness.   Clinical Impression  Pt in bed upon arrival and agreeable to PT eval. Pt presents to therapy session slightly below functional mobility baseline. Pt presents with L sided weakness from prior CVA, decreased balance, and decreased functional mobility. Pt reported feeling SOB with 89% sats on supplemental oxygen. Pt was able to ambulate a short distance with CGA and RW today. Pt would benefit from acute skilled PT to address functional impairments. Recommending post-acute HHPT and home health aide. Acute PT to follow.          If plan is discharge home, recommend the following: A little help with walking and/or transfers;A little help with bathing/dressing/bathroom   Can travel by private vehicle        Equipment Recommendations None recommended by PT (pt owns equipment)     Functional Status Assessment Patient has had a recent decline in their functional status and demonstrates the ability to make significant improvements in function in a reasonable and predictable amount of time.     Precautions / Restrictions Precautions Precautions: Fall;Other (comment) Precaution Comments: monitor O2, prior CVA with L weakness Restrictions Weight Bearing Restrictions: No      Mobility  Bed Mobility Overal bed mobility: Needs Assistance Bed Mobility: Supine to Sit, Sit to Supine     Supine to sit: HOB elevated, Used rails, Min assist Sit to supine: Contact guard assist   General bed mobility comments: Min A  to assist LLE to EOB. Pt able to swing B LE back into bed with CGA for safety. Pt reports typically sleeping in recliner at home    Transfers Overall transfer level: Needs assistance Equipment used: Rolling walker (2 wheels) Transfers: Sit to/from Stand Sit to Stand: Contact guard assist           General transfer comment: Pt rocks to build momentum to stand, CGA for RW management as pt tends to pull RW posteriorly. Pt able to place L UE onto RW to grip    Ambulation/Gait Ambulation/Gait assistance: Contact guard assist Gait Distance (Feet): 20 Feet Assistive device: Rolling walker (2 wheels) Gait Pattern/deviations: Step-to pattern, Decreased step length - left, Decreased dorsiflexion - left Gait velocity: dec     General Gait Details: Pt uses momentum to swing L LE forward, increased medial/lateral sway,        Balance Overall balance assessment: Needs assistance Sitting-balance support: No upper extremity supported, Feet supported Sitting balance-Leahy Scale: Fair     Standing balance support: Bilateral upper extremity supported, During functional activity, Reliant on assistive device for balance Standing balance-Leahy Scale: Poor Standing balance comment: reliant on RW for stability           Pertinent Vitals/Pain Pain Assessment Pain Assessment: Faces Faces Pain Scale: Hurts a little bit Pain Location: chest, back Pain Descriptors / Indicators: Aching Pain Intervention(s): Limited activity within patient's tolerance, Monitored during session, Repositioned    Home Living Family/patient expects to be discharged to:: Private residence Living Arrangements: Alone Available Help at Discharge: Family;Available PRN/intermittently Type of Home: House Home Access: Level entry  Home Layout: One level Home Equipment: Rollator (4 wheels);Shower seat - built in;Grab bars - toilet;Grab bars - tub/shower      Prior Function Prior Level of Function : Needs  assist             Mobility Comments: uses rollator for mobility ADLs Comments: sisters stop by to assist with showering tasks, some LB dressing aspects (socks/shoes). able to make simple meals, goes grocery shopping with nephew     Extremity/Trunk Assessment   Upper Extremity Assessment Upper Extremity Assessment: Defer to OT evaluation LUE Deficits / Details: L weakness from prior CVA. able to lift UE, assist to hold items though impaired coordination noted. LUE Coordination: decreased fine motor;decreased gross motor    Lower Extremity Assessment Lower Extremity Assessment: LLE deficits/detail LLE Deficits / Details: knee ext-  2+/5, ankle DF- 3/5 LLE Sensation: decreased light touch    Cervical / Trunk Assessment Cervical / Trunk Assessment: Normal  Communication   Communication Communication: No apparent difficulties Cueing Techniques: Verbal cues  Cognition Arousal: Alert Behavior During Therapy: WFL for tasks assessed/performed Overall Cognitive Status: Within Functional Limits for tasks assessed        General Comments General comments (skin integrity, edema, etc.): 89% on supplemental O2. Pt reported feeling SOB that improved with rest break and deep breathing     PT Assessment Patient needs continued PT services  PT Problem List Decreased strength;Decreased range of motion;Decreased activity tolerance;Decreased balance;Decreased mobility       PT Treatment Interventions DME instruction;Neuromuscular re-education;Gait training;Stair training;Functional mobility training;Therapeutic activities;Therapeutic exercise;Balance training;Patient/family education    PT Goals (Current goals can be found in the Care Plan section)  Acute Rehab PT Goals Patient Stated Goal: to go home PT Goal Formulation: With patient Time For Goal Achievement: 08/04/23 Potential to Achieve Goals: Good    Frequency Min 1X/week        AM-PAC PT "6 Clicks" Mobility  Outcome  Measure Help needed turning from your back to your side while in a flat bed without using bedrails?: A Little Help needed moving from lying on your back to sitting on the side of a flat bed without using bedrails?: A Little Help needed moving to and from a bed to a chair (including a wheelchair)?: A Little Help needed standing up from a chair using your arms (e.g., wheelchair or bedside chair)?: A Little Help needed to walk in hospital room?: A Little Help needed climbing 3-5 steps with a railing? : A Lot 6 Click Score: 17    End of Session Equipment Utilized During Treatment: Oxygen Activity Tolerance: Patient tolerated treatment well Patient left: in bed;with call bell/phone within reach;with bed alarm set Nurse Communication: Mobility status PT Visit Diagnosis: Unsteadiness on feet (R26.81);Muscle weakness (generalized) (M62.81)    Time: 5784-6962 PT Time Calculation (min) (ACUTE ONLY): 20 min   Charges:   PT Evaluation $PT Eval Low Complexity: 1 Low   PT General Charges $$ ACUTE PT VISIT: 1 Visit         [name], PT, DPT Secure Chat Preferred  Rehab Office [phone]   [name] 07/21/2023, 11:12 AM
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
## 2023-07-21 NOTE — Plan of Care (Signed)

## 2023-07-21 NOTE — Assessment & Plan Note (Addendum)
Echocardiogram with EF 60 to 65%, moderate concentric LVH, RV systolic function preserved, moderate to large pericardial effusion, circumferential with no signs of tamponade, normal size left and right atriums, no significant valvular disease.   Urine output is 2,000  ml Systolic blood pressure is 155 to 142 mmHg.   Plan to continue diuresis with oral furosemide.  Add SGLT 2 inh if good toleration may add spironolactone.   Suspected pericarditis, will continue with colchicine.  Ideally will avoid steroids but considering significant COPD exacerbation will continue steroids for now.  CRP is 9,3 and sed rate 90.

## 2023-07-21 NOTE — Assessment & Plan Note (Addendum)
CKD stage 3a.  Renal function with serum cr at 1,23 with K at 4,3 and serum bicarbonate at 30. Na 137   Will transition to oral furosemide in am.  Follow up renal function in am.

## 2023-07-21 NOTE — Assessment & Plan Note (Signed)
Calculated BMI is 38,2

## 2023-07-21 NOTE — Assessment & Plan Note (Addendum)
Wheezing has improved from yesterday but not yet at baseline.   Plan to continue aggressive bronchodilator therapy. Inhaled and systemic corticosteroids.  Airway clearing techniques with flutter valve and incentive spirometer.  Antitussive agents. PT and OT.

## 2023-07-21 NOTE — Assessment & Plan Note (Signed)
Cpap

## 2023-07-21 NOTE — Progress Notes (Signed)
Initial Nutrition Assessment  DOCUMENTATION CODES:   Obesity unspecified  INTERVENTION:  Liberalize diet to 2 gram sodium  Double protein portions with meals Nighttime protein containing snack ([place] sandwich with tomato and peanut btuter with crackers) "Low Sodium Nutrition Therapy" handout added to AVS  NUTRITION DIAGNOSIS:   Increased nutrient needs related to chronic illness (COPD) as evidenced by estimated needs.  GOAL:   Patient will meet greater than or equal to 90% of their needs  MONITOR:   PO intake, Labs, Weight trends, I & O's  REASON FOR ASSESSMENT:   Consult Assessment of nutrition requirement/status  ASSESSMENT:   Pt admitted with chest pain and SOB x2 weeks d/t acute HFpEF. Recently admitted to [facility] 9/26-10/1 for COPD and PNA. PMH significant for pericardial effusion, COPD, sleep apnea, HTN and arthritis.  Spoke with pt at bedside. He reports that he has a great appetite despite breathing difficulties and denies any changes to his intake. He lives alone but loves to cook for his 6 sisters. He enjoys hunting for his protein sources. Pt states that he still feels hungry after meals during admission. Will place order for double protein with meals and nighttime protein containing snack.   Discussing continuing diet of low sodium to prevent additional fluid retention in the setting of HF and pericardial effusion.   Meal completions: 10/7: 100% breakfast  Pt reports that his weight has been about 260 lbs and he has lost about 20 lbs since admission to [facility] on 9/26. Pt endorses receiving lasix during both admissions which is likely cause of weight change. Dry weight currently unknown. Limited weight history on file to review over the last year. Unable to assess for recent weight changes. Will continue to monitor throughout admission.  Pt reports abdomen distended more than usual. Reports last BM was Saturday. Pt would likely benefit from  the addition of a bowel regimen.   Medications: abx, prednisone  Labs:  BUN 25  NUTRITION - FOCUSED PHYSICAL EXAM:  Flowsheet Row Most Recent Value  Orbital Region No depletion  Upper Arm Region No depletion  Thoracic and Lumbar Region No depletion  Buccal Region No depletion  Temple Region No depletion  Clavicle Bone Region No depletion  Clavicle and Acromion Bone Region No depletion  Scapular Bone Region No depletion  Dorsal Hand No depletion  Patellar Region No depletion  Anterior Thigh Region No depletion  Posterior Calf Region No depletion  Edema (RD Assessment) Mild  [BLE]  Hair Reviewed  Eyes Reviewed  Mouth Reviewed  Skin Reviewed  Nails Reviewed       Diet Order:   Diet Order             Diet 2 gram sodium Room service appropriate? Yes; Fluid consistency: Thin; Fluid restriction: 1500 mL Fluid  Diet effective now                   EDUCATION NEEDS:   Education needs have been addressed  Skin:  Skin Assessment: Reviewed RN Assessment  Last BM:  10/5  Height:   Ht Readings from Last 1 Encounters:  07/20/23 5' 6.5" (1.689 m)    Weight:   Wt Readings from Last 1 Encounters:  07/21/23 109.2 kg    Ideal Body Weight:  67.3 kg  BMI:  Body mass index is 38.27 kg/m.  Estimated Nutritional Needs:   Kcal:  2000-2200  Protein:  100-115g  Fluid:  >/=2L  Drusilla Kanner, RDN, LDN Clinical Nutrition

## 2023-07-21 NOTE — Evaluation (Signed)
Occupational Therapy Evaluation Patient Details Name: [name] MRN: [id number] DOB: 1961/01/23 Today's Date: 07/21/2023   History of Present Illness 62 y/o male transferred from [facility] to [facility] 07/20/23 after found to be tachycardic hypotensive with pericardial effusion. Prior admit 3 days prior to [facility] w/ pericardial effusion. PMHx: HFpEF, pericardial effusion, COPD, sleep apnea on chronic supplemental O2 (3L), HTN, arthritis, CKD, prior CVA w/ left sided weakness.   Clinical Impression   PTA, pt lives alone, typically Modified Independent with mobility using Rollator and has intermittent assist from family for showering/LB dressing aspects. Pt presents now with deficits in cardiopulmonary tolerance, chronic L sided weakness and impaired dynamic standing balance. Overall, pt requires CGA for mobility with RW, able to stand at sink > 7 min for ADLs though desat to 87% on 4 L O2. Pt requires up to Mod A for ADLs due to deficits. Pt open to [facility] therapies (was supposed to start HHPT today) and inquiring about qualifications for a [facility] aide.        If plan is discharge home, recommend the following: A little help with walking and/or transfers;A little help with bathing/dressing/bathroom;Assistance with cooking/housework    Functional Status Assessment  Patient has had a recent decline in their functional status and demonstrates the ability to make significant improvements in function in a reasonable and predictable amount of time.  Equipment Recommendations  None recommended by OT    Recommendations for Other Services Other (comment) (HH aide)     Precautions / Restrictions Precautions Precautions: Fall;Other (comment) Precaution Comments: monitor O2, prior CVA with L weakness Restrictions Weight Bearing Restrictions: No      Mobility Bed Mobility Overal bed mobility: Needs Assistance Bed Mobility: Supine to Sit, Sit to Supine     Supine to sit: Min assist, HOB  elevated, Used rails Sit to supine: Mod assist   General bed mobility comments: Min A to assist LLE to EOB and light assist to lift trunk. BLE back to bed at end of session. pt reports typically sleeping in recliner at home    Transfers Overall transfer level: Needs assistance Equipment used: Rolling walker (2 wheels) Transfers: Sit to/from Stand Sit to Stand: Contact guard assist                  Balance Overall balance assessment: Needs assistance Sitting-balance support: No upper extremity supported, Feet supported Sitting balance-Leahy Scale: Fair     Standing balance support: Bilateral upper extremity supported, Single extremity supported, During functional activity Standing balance-Leahy Scale: Poor                             ADL either performed or assessed with clinical judgement   ADL Overall ADL's : Needs assistance/impaired Eating/Feeding: Set up   Grooming: Supervision/safety;Standing;Wash/dry face;Oral care Grooming Details (indicate cue type and reason): +shaving standing at sink. some assist to open toothpaste tube but able to manage remainder of tasks without assist Upper Body Bathing: Sitting;Moderate assistance   Lower Body Bathing: Minimal assistance;Sitting/lateral leans;Sit to/from stand   Upper Body Dressing : Moderate assistance;Sitting   Lower Body Dressing: Moderate assistance;Sitting/lateral leans;Sit to/from stand   Toilet Transfer: Contact guard assist;Ambulation;Rolling walker (2 wheels)   Toileting- Clothing Manipulation and Hygiene: Minimal assistance;Sit to/from stand;Sitting/lateral lean       Functional mobility during ADLs: Contact guard assist;Rolling walker (2 wheels);Cueing for sequencing;Cueing for safety General ADL Comments: discussed pulse ox use to monitor O2,  pt inquiring about getting HH aide at home to relieve caregiver burden of assisting with ADLs     Vision Baseline Vision/History: 1 Wears  glasses Ability to See in Adequate Light: 0 Adequate Patient Visual Report: No change from baseline Vision Assessment?: No apparent visual deficits     Perception         Praxis         Pertinent Vitals/Pain Pain Assessment Pain Assessment: Faces Faces Pain Scale: Hurts a little bit Pain Location: chest, back     Extremity/Trunk Assessment Upper Extremity Assessment Upper Extremity Assessment: Right hand dominant;LUE deficits/detail LUE Deficits / Details: L weakness from prior CVA. able to lift UE, assist to hold items though impaired coordination noted. LUE Coordination: decreased fine motor;decreased gross motor   Lower Extremity Assessment Lower Extremity Assessment: Defer to PT evaluation   Cervical / Trunk Assessment Cervical / Trunk Assessment: Normal   Communication Communication Communication: No apparent difficulties Cueing Techniques: Verbal cues   Cognition Arousal: Alert Behavior During Therapy: WFL for tasks assessed/performed Overall Cognitive Status: Within Functional Limits for tasks assessed                                       General Comments  SpO2 down to 87% on 4 L O2. recovers to 91% sitting rest break >1 min. HHPT called during session - had planned to see pt today    Exercises     Shoulder Instructions      Home Living Family/patient expects to be discharged to:: Private residence Living Arrangements: Alone Available Help at Discharge: Family;Available PRN/intermittently Type of Home: House Home Access: Level entry     Home Layout: One level     Bathroom Shower/Tub: Producer, television/film/video: Handicapped height     Home Equipment: Rollator (4 wheels);Shower seat - built in;Grab bars - toilet;Grab bars - tub/shower          Prior Functioning/Environment Prior Level of Function : Needs assist             Mobility Comments: uses rollator for mobility ADLs Comments: sisters stop by to assist  with showering tasks, some LB dressing aspects (socks/shoes). able to make simple meals, goes grocery shopping with nephew        OT Problem List: Decreased strength;Decreased activity tolerance;Impaired balance (sitting and/or standing);Decreased coordination;Decreased knowledge of use of DME or AE;Impaired UE functional use      OT Treatment/Interventions: Self-care/ADL training;Therapeutic exercise;Energy conservation;DME and/or AE instruction;Therapeutic activities;Patient/family education;Balance training    OT Goals(Current goals can be found in the care plan section) Acute Rehab OT Goals Patient Stated Goal: have an aide to assist at home OT Goal Formulation: With patient Time For Goal Achievement: 08/04/23 Potential to Achieve Goals: Good ADL Goals Pt Will Perform Lower Body Bathing: with modified independence;sit to/from stand;with adaptive equipment;sitting/lateral leans Pt Will Perform Lower Body Dressing: with modified independence;sit to/from stand;sitting/lateral leans;with adaptive equipment Pt Will Transfer to Toilet: with modified independence;ambulating Pt/caregiver will Perform Home Exercise Program: Increased strength;Left upper extremity;Independently;With written HEP provided Additional ADL Goal #1: Pt to verbalize at least 3 energy conservation strategies to implement at home  OT Frequency: Min 1X/week    Co-evaluation              AM-PAC OT "6 Clicks" Daily Activity     Outcome Measure Help from another person eating meals?: A Little  Help from another person taking care of personal grooming?: A Little Help from another person toileting, which includes using toliet, bedpan, or urinal?: A Little Help from another person bathing (including washing, rinsing, drying)?: A Lot Help from another person to put on and taking off regular upper body clothing?: A Lot Help from another person to put on and taking off regular lower body clothing?: A Lot 6 Click Score:  15   End of Session Equipment Utilized During Treatment: Gait belt;Rolling walker (2 wheels);Oxygen Nurse Communication: Mobility status  Activity Tolerance: Patient tolerated treatment well Patient left: in bed;with call bell/phone within reach  OT Visit Diagnosis: Unsteadiness on feet (R26.81);Other abnormalities of gait and mobility (R26.89);Muscle weakness (generalized) (M62.81)                Time: 0981-1914 OT Time Calculation (min): 40 min Charges:  OT General Charges $OT Visit: 1 Visit OT Evaluation $OT Eval Low Complexity: 1 Low OT Treatments $Self Care/Home Management : 23-37 mins  Bradd Canary, OTR/L Acute Rehab Services Office: [phone]   Lorre Munroe 07/21/2023, 9:06 AM

## 2023-07-21 NOTE — Discharge Instructions (Signed)

## 2023-07-21 NOTE — Hospital Course (Signed)
Mr. Guertin was admitted to the hospital with the working diagnosis of heart failure exacerbation.   62 yo male with the past medical history of heart failure, COPD, chronic hypoxia, hypertension, and arthritis who presented with dyspnea and chest pain. Recent hospitalization 09/25 to 07/15/23 at [facility] with diagnosed with pneumonia, COPD and pericardial effusion. He was medically treated and discharged home. He returned with chest pain to the ED on 10/06, because persistent pericardial effusion was transferred from [facility] to [facility] ED for further evaluation.

## 2023-07-21 NOTE — Progress Notes (Signed)
DAILY PROGRESS NOTE   Patient Name: [name] Date of Encounter: 07/21/2023 Cardiologist: None  Chief Complaint   Breathing has improved  Patient Profile   [name] is a 62 y.o. male with a hx of hypertension, COPD, history of tobacco abuse, OSA/OHS, prediabetes who is being seen 07/20/2023 for the evaluation of shortness of breath and pericardial effusion at the request of Dr. Theresia Lo.   Subjective   Net negative 890 mL overnight. Echo yesterday shows moderate to large pericardial effusion, normal LVEF and RVEF with RA pressure of 3 mmHg. He reports chronic pain - difficult to say if chest is better. BNP today 120.9.  Objective   Vitals:   07/21/23 0350 07/21/23 0414 07/21/23 0723 07/21/23 0805  BP: 123/83  127/84   Pulse: 87  91   Resp: 18  17 20   Temp: (!) 97.3 F (36.3 C)  98 F (36.7 C)   TempSrc: Oral  Oral   SpO2: 95%  93%   Weight:  109.2 kg    Height:        Intake/Output Summary (Last 24 hours) at 07/21/2023 4098 Last data filed at 07/21/2023 1191 Gross per 24 hour  Intake 627 ml  Output 1605 ml  Net -978 ml   Filed Weights   07/20/23 2052 07/21/23 0414  Weight: 109.5 kg 109.2 kg    Physical Exam   General appearance: alert and no distress Neck: no carotid bruit, no JVD, and thyroid not enlarged, symmetric, no tenderness/mass/nodules Lungs: wheezes faint expiratory Heart: regular rate and rhythm Abdomen: soft, non-tender; bowel sounds normal; no masses,  no organomegaly and protuberant, no fluid wave Extremities: edema 1+ bilateral Pulses: 2+ and symmetric Skin: Skin color, texture, turgor normal. No rashes or lesions Neurologic: Mental status: Alert, oriented, thought content appropriate, left UE weakness from prior stroke Psych: Pleasant  Inpatient Medications    Scheduled Meds:  azithromycin  500 mg Oral Daily   colchicine  0.6 mg Oral BID   furosemide  40 mg Intravenous Q12H   heparin  5,000 Units Subcutaneous Q8H    predniSONE  40 mg Oral Q breakfast   sodium chloride flush  3 mL Intravenous Q12H   sodium chloride flush  3 mL Intravenous Q12H   umeclidinium bromide  1 puff Inhalation Daily    Continuous Infusions:  sodium chloride      PRN Meds: sodium chloride, albuterol, oxyCODONE-acetaminophen, sodium chloride flush   Labs   Results for orders placed or performed during the hospital encounter of 07/20/23 (from the past 48 hour(s))  HIV Antibody (routine testing w rflx)     Status: None   Collection Time: 07/20/23  9:17 PM  Result Value Ref Range   HIV Screen 4th Generation wRfx Non Reactive Non Reactive    Comment: Performed at [facility], [address] [place], [place] [address]  CBC     Status: Abnormal   Collection Time: 07/20/23  9:17 PM  Result Value Ref Range   WBC 19.0 (H) 4.0 - 10.5 K/uL   RBC 4.32 4.22 - 5.81 MIL/uL   Hemoglobin 11.2 (L) 13.0 - 17.0 g/dL   HCT 56.2 (L) 13.0 - 86.5 %   MCV 83.1 80.0 - 100.0 fL   MCH 25.9 (L) 26.0 - 34.0 pg   MCHC 31.2 30.0 - 36.0 g/dL   RDW 78.4 (H) 69.6 - 29.5 %   Platelets 490 (H) 150 - 400 K/uL   nRBC 0.0 0.0 -  0.2 %    Comment: Performed at [facility], [address] [place], [place] [address]  Creatinine, serum     Status: Abnormal   Collection Time: 07/20/23  9:17 PM  Result Value Ref Range   Creatinine, Ser 1.32 (H) 0.61 - 1.24 mg/dL   GFR, Estimated >01 >02 mL/min    Comment: (NOTE) Calculated using the CKD-EPI Creatinine Equation (2021) Performed at [facility], [address] [place], [place] [address]   Basic metabolic panel     Status: Abnormal   Collection Time: 07/21/23  3:14 AM  Result Value Ref Range   Sodium 137 135 - 145 mmol/L   Potassium 4.3 3.5 - 5.1 mmol/L   Chloride 97 (L) 98 - 111 mmol/L   CO2 30 22 - 32 mmol/L   Glucose, Bld 155 (H) 70 - 99 mg/dL    Comment: Glucose reference range applies only to samples taken after fasting for at least 8 hours.   BUN 25 (H) 6 - 20 mg/dL    Creatinine, Ser 6.44 0.61 - 1.24 mg/dL   Calcium 8.3 (L) 8.9 - 10.3 mg/dL   GFR, Estimated >03 >47 mL/min    Comment: (NOTE) Calculated using the CKD-EPI Creatinine Equation (2021)    Anion gap 10 5 - 15    Comment: Performed at [facility], [address], [place], [place] [address]  Brain natriuretic peptide     Status: Abnormal   Collection Time: 07/21/23  3:14 AM  Result Value Ref Range   B Natriuretic Peptide 120.9 (H) 0.0 - 100.0 pg/mL    Comment: Performed at [facility], [address], [place], [place] [address]  Magnesium     Status: None   Collection Time: 07/21/23  3:14 AM  Result Value Ref Range   Magnesium 2.0 1.7 - 2.4 mg/dL    Comment: Performed at [facility], [address], [place], [place] [address]    ECG   NSR at 85 - Personally Reviewed  Telemetry   Normal sinus rhythm - Personally Reviewed  Radiology    CT Angio Chest PE W/Cm &/Or Wo Cm  Result Date: 07/20/2023 CLINICAL DATA:  Per triage note. "[facility] this evening due to pericardial effusion. (LARGE). Patient states he was discharged for this matter about 3 days ago but nothing was ever done. He is on 3L at home but in route due to air hunger he is on 5L and stating at 96%. Coughing up thick phelm. Patient received Lovenox this morning at [facility]. Does have history of past stroke and has issues with left sided weakness. Patient is alert and aware x4." EXAM: CT ANGIOGRAPHY CHEST WITH CONTRAST TECHNIQUE: Multidetector CT imaging of the chest was performed using the standard protocol during bolus administration of intravenous contrast. Multiplanar CT image reconstructions and MIPs were obtained to evaluate the vascular anatomy. RADIATION DOSE REDUCTION: This exam was performed according to the departmental dose-optimization program which includes automated exposure control, adjustment of the mA and/or kV according to patient size and/or use of iterative reconstruction  technique. CONTRAST:  75mL OMNIPAQUE IOHEXOL 350 MG/ML SOLN COMPARISON:  CT chest 07/20/2023 FINDINGS: Cardiovascular: Similar large pericardial effusion with diffuse pericardial thickening. The effusion is low to intermediate density (Hounsfield units 30). Negative for acute pulmonary embolism. Normal caliber thoracic aorta. Aortic atherosclerotic calcification. Mediastinum/Nodes: Bowing of the posterior tracheal compatible with expiratory phase. Unremarkable esophagus. Right paratracheal lymphadenopathy measuring up to  1.3 cm. Lungs/Pleura: Mild bronchial wall thickening and scattered mucous plugging. Subtotal atelectasis in the left upper and left lower lobes. Mild atelectasis or scarring right lower lobe. No pleural effusion or pneumothorax. Upper Abdomen: No acute abnormality. Musculoskeletal: No acute fracture. Review of the MIP images confirms the above findings. IMPRESSION: 1. Similar large pericardial effusion with diffuse pericardial thickening, similar to earlier today and concerning for acute pericarditis. 2. Negative for acute pulmonary embolism. 3. Mild bronchial wall thickening and scattered mucous plugging. Left-greater-than-right atelectasis. 4. Right paratracheal lymphadenopathy measuring up to 1.3 cm. Continued attention on follow-up Aortic Atherosclerosis (ICD10-I70.0). Electronically Signed   By: Minerva Fester M.D.   On: 07/20/2023 19:22   ECHOCARDIOGRAM COMPLETE  Result Date: 07/20/2023    ECHOCARDIOGRAM REPORT   Patient Name:   [name] Date of Exam: 07/20/2023 Medical Rec #:  [id number]       Height:       66.0 in Accession #:    [id number]      Weight:       240.0 lb Date of Birth:  12-07-1960       BSA:          2.161 m Patient Age:    61 years        BP:           114/79 mmHg Patient Gender: M               HR:           96 bpm. Exam Location:  Inpatient Procedure: 2D Echo, Cardiac Doppler and Color Doppler STAT ECHO Indications:    pericardial effusion  History:        Patient  has prior history of Echocardiogram examinations. Risk                 Factors:Sleep Apnea and Hypertension.  Sonographer:    Delcie Roch RDCS Referring Phys: 3 DAYNA N DUNN IMPRESSIONS  1. Left ventricular ejection fraction, by estimation, is 60 to 65%. The left ventricle has normal function. The left ventricle has no regional wall motion abnormalities. There is moderate concentric left ventricular hypertrophy. Left ventricular diastolic parameters are indeterminate.  2. Right ventricular systolic function is normal. The right ventricular size is normal. Tricuspid regurgitation signal is inadequate for assessing PA pressure.  3. Moderate to large. The pericardial effusion is circumferential. There is no evidence of cardiac tamponade.  4. The mitral valve is normal in structure. No evidence of mitral valve regurgitation. No evidence of mitral stenosis.  5. The aortic valve has an indeterminant number of cusps. Aortic valve regurgitation is not visualized. No aortic stenosis is present.  6. The inferior vena cava is normal in size with greater than 50% respiratory variability, suggesting right atrial pressure of 3 mmHg. FINDINGS  Left Ventricle: Left ventricular ejection fraction, by estimation, is 60 to 65%. The left ventricle has normal function. The left ventricle has no regional wall motion abnormalities. The left ventricular internal cavity size was normal in size. There is  moderate concentric left ventricular hypertrophy. Left ventricular diastolic parameters are indeterminate. Right Ventricle: The right ventricular size is normal. No increase in right ventricular wall thickness. Right ventricular systolic function is normal. Tricuspid regurgitation signal is inadequate for assessing PA pressure. Left Atrium: Left atrial size was normal in size. Right Atrium: Right atrial size was normal in size. Pericardium: Moderate to large. The pericardial effusion is circumferential. There is no evidence of  cardiac tamponade. Presence  of epicardial fat layer. Mitral Valve: The mitral valve is normal in structure. No evidence of mitral valve regurgitation. No evidence of mitral valve stenosis. Tricuspid Valve: The tricuspid valve is normal in structure. Tricuspid valve regurgitation is not demonstrated. No evidence of tricuspid stenosis. Aortic Valve: The aortic valve has an indeterminant number of cusps. Aortic valve regurgitation is not visualized. No aortic stenosis is present. Pulmonic Valve: The pulmonic valve was not well visualized. Pulmonic valve regurgitation is not visualized. No evidence of pulmonic stenosis. Aorta: The aortic root is normal in size and structure. Venous: The inferior vena cava is normal in size with greater than 50% respiratory variability, suggesting right atrial pressure of 3 mmHg. IAS/Shunts: No atrial level shunt detected by color flow Doppler.  LEFT VENTRICLE PLAX 2D LVIDd:         4.50 cm Diastology LVIDs:         2.70 cm LV e' medial:    6.09 cm/s LV PW:         1.30 cm LV E/e' medial:  11.4 LV IVS:        1.60 cm LV e' lateral:   5.87 cm/s                        LV E/e' lateral: 11.9  RIGHT VENTRICLE             IVC RV Basal diam:  2.60 cm     IVC diam: 2.20 cm RV S prime:     11.70 cm/s TAPSE (M-mode): 1.2 cm LEFT ATRIUM           Index        RIGHT ATRIUM           Index LA diam:      4.00 cm 1.85 cm/m   RA Area:     22.00 cm LA Vol (A2C): 53.1 ml 24.57 ml/m  RA Volume:   65.00 ml  30.08 ml/m  AORTIC VALVE LVOT Vmax:   105.00 cm/s LVOT Vmean:  69.400 cm/s LVOT VTI:    0.180 m  AORTA Ao Asc diam: 3.10 cm MITRAL VALVE MV Area (PHT): 4.80 cm    SHUNTS MV Decel Time: 158 msec    Systemic VTI: 0.18 m MV E velocity: 69.70 cm/s MV A velocity: 91.30 cm/s MV E/A ratio:  0.76 Kardie Tobb DO Electronically signed by Thomasene Ripple DO Signature Date/Time: 07/20/2023/3:03:53 PM    Final     Cardiac Studies   N/A  Assessment   Principal Problem:   Acute heart failure with preserved  ejection fraction (HFpEF) (HCC) Active Problems:   COPD with acute exacerbation (HCC)   Sleep apnea in adult   Pericardial effusion without cardiac tamponade   CKD (chronic kidney disease), stage III (HCC)   Plan   Moderate to large pericardial effusion - may be inflammatory. Possible acute HFPEF - agree with anti-inflammatories and diuresis. BNP minimally elevated and RA pressure was 3 on echo - will continue IV lasix today and possibly transition to oral lasix tomorrow.  Time Spent Directly with Patient:  I have spent a total of 25 minutes with the patient reviewing hospital notes, telemetry, EKGs, labs and examining the patient as well as establishing an assessment and plan that was discussed personally with the patient.  > 50% of time was spent in direct patient care.  Length of Stay:  LOS: 1 day   Chrystie Nose, MD, [facility], FACP  [facility]  [facility]  Medical Director of the Advanced Lipid Disorders &  Cardiovascular Risk Reduction Clinic Diplomate of the American Board of Clinical Lipidology Attending Cardiologist  Direct Dial: [phone]  Fax: [phone]  Website:  [email or web address] Nicely Herbert Marken 07/21/2023, 9:27 AM

## 2023-07-21 NOTE — Progress Notes (Signed)
Progress Note   Patient: [name] WGN:[id number] DOB: [birth year] DOA: 07/20/2023     1 DOS: the patient was seen and examined on 07/21/2023   Brief hospital course: [name] was admitted to the hospital with the working diagnosis of heart failure exacerbation.   62 yo male with the past medical history of heart failure, COPD, chronic hypoxia, hypertension, and arthritis who presented with dyspnea and chest pain. Recent hospitalization 09/25 to 07/15/23 at [facility] with diagnosed with pneumonia, COPD and pericardial effusion. He was medically treated and discharged home. He returned with chest pain to the ED on 10/06, because persistent pericardial effusion was transferred from [facility] to [facility] ED for further evaluation.     Assessment and Plan: * COPD with acute exacerbation (HCC) Patient with significant signs of air trapping.   Plan to continue aggressive bronchodilator therapy. Inhaled and systemic corticosteroids.  Airway clearing techniques with flutter valve and incentive spirometer.  Antitussive agents. Check chest radiograph PT and OT.   Acute on chronic diastolic CHF (congestive heart failure) (HCC) Echocardiogram with EF 60 to 65%, moderate concentric LVH, RV systolic function preserved, moderate to large pericardial effusion, circumferential with no signs of tamponade, normal size left and right atriums, no significant valvular disease.   Urine output is 1,280 ml Systolic blood pressure is 124 to 125 mmHg.   Plan to continue diuresis with oral furosemide.  Possible addition of SGLT 2 inh and spironolactone.   Suspected pericarditis, will continue with allopurinol.   Acute kidney injury superimposed on chronic kidney disease (HCC) CKD stage 3a.  Renal function with serum cr at 1,23 with K at 4,3 and serum bicarbonate at 30. Na 137   Will transition to oral furosemide in am.  Follow up renal function in am.   Sleep apnea in adult Cpap  Obesity,  class 2 Calculated BMI is 38,2        Subjective: Patient continue to have dyspnea, wheezing and cough. Continue to have pleuritic chest pain, no nausea or vomiting.   Physical Exam: Vitals:   07/21/23 0723 07/21/23 0805 07/21/23 1102 07/21/23 1536  BP: 127/84  125/83 125/85  Pulse: 91  84 87  Resp: 17 20 18 17   Temp: 98 F (36.7 C)  (!) 97.3 F (36.3 C) 98.1 F (36.7 C)  TempSrc: Oral  Oral Oral  SpO2: 93%  93% 95%  Weight:      Height:       Neurology awake and alert ENT with mild pallor Cardiovascular with S1 and S2 present and regular with no gallops, rubs or murmurs Respiratory with prolonged expiratory phase, with positive expiratory wheezing, scattered rales and rhonchi Abdomen with no distention  Trace non pitting lower extremity edema  Data Reviewed:    Family Communication: no family at the bedside   Disposition: Status is: Inpatient Remains inpatient appropriate because: COPD exacerbation   Planned Discharge Destination: Home    Author: Coralie Keens, MD 07/21/2023 5:09 PM  For on call review [email or web address].

## 2023-07-21 NOTE — Plan of Care (Signed)
  Problem: Health Behavior/Discharge Planning: Goal: Ability to manage health-related needs will improve Outcome: Progressing   Problem: Clinical Measurements: Goal: Ability to maintain clinical measurements within normal limits will improve Outcome: Progressing   Problem: Clinical Measurements: Goal: Will remain free from infection Outcome: Progressing   Problem: Clinical Measurements: Goal: Diagnostic test results will improve Outcome: Progressing   

## 2023-07-22 ENCOUNTER — Other Ambulatory Visit (HOSPITAL_COMMUNITY): Payer: Self-pay

## 2023-07-22 DIAGNOSIS — G4730 Sleep apnea, unspecified: Secondary | ICD-10-CM | POA: Diagnosis not present

## 2023-07-22 DIAGNOSIS — N179 Acute kidney failure, unspecified: Secondary | ICD-10-CM | POA: Diagnosis not present

## 2023-07-22 DIAGNOSIS — J441 Chronic obstructive pulmonary disease with (acute) exacerbation: Secondary | ICD-10-CM | POA: Diagnosis not present

## 2023-07-22 DIAGNOSIS — I5033 Acute on chronic diastolic (congestive) heart failure: Secondary | ICD-10-CM | POA: Diagnosis not present

## 2023-07-22 DIAGNOSIS — I3139 Other pericardial effusion (noninflammatory): Secondary | ICD-10-CM | POA: Diagnosis not present

## 2023-07-22 LAB — BASIC METABOLIC PANEL
Anion gap: 6 (ref 5–15)
BUN: 32 mg/dL — ABNORMAL HIGH (ref 6–20)
CO2: 36 mmol/L — ABNORMAL HIGH (ref 22–32)
Calcium: 8.3 mg/dL — ABNORMAL LOW (ref 8.9–10.3)
Chloride: 95 mmol/L — ABNORMAL LOW (ref 98–111)
Creatinine, Ser: 1.18 mg/dL (ref 0.61–1.24)
GFR, Estimated: >60 mL/min (ref >60)
Glucose, Bld: 154 mg/dL — ABNORMAL HIGH (ref 70–99)
Potassium: 4 mmol/L (ref 3.5–5.1)
Sodium: 137 mmol/L (ref 135–145)

## 2023-07-22 LAB — MAGNESIUM: Magnesium: 2.1 mg/dL (ref 1.7–2.4)

## 2023-07-22 MED ORDER — FUROSEMIDE 40 MG PO TABS
40.0000 mg | ORAL_TABLET | Freq: Every day | ORAL | Status: DC
Start: 2023-07-22 — End: 2023-07-22

## 2023-07-22 MED ORDER — FUROSEMIDE 40 MG PO TABS
40.0000 mg | ORAL_TABLET | Freq: Every day | ORAL | Status: DC
  Administered 2023-07-23 – 2023-07-26 (×4): 40 mg via ORAL
  Filled 2023-07-22 (×4): qty 1

## 2023-07-22 NOTE — Plan of Care (Signed)

## 2023-07-22 NOTE — TOC Benefit Eligibility Note (Signed)
Patient Product/process development scientist completed.    The patient is insured through [facility]. Patient has Medicare and is not eligible for a copay card, but may be able to apply for patient assistance, if available.    Ran test claim for colchicine 0.6 mg  and the current 30 day co-pay is $0.00.   This test claim was processed through [facility]- copay amounts may vary at other pharmacies due to pharmacy/plan contracts, or as the patient moves through the different stages of their insurance plan.     Roland Earl, CPHT Pharmacy Technician III Certified Patient Advocate [facility] Patient Advocate Team Direct Number: [phone]  Fax: [phone]

## 2023-07-22 NOTE — Progress Notes (Signed)
Mobility Specialist Progress Note;   07/22/23 1510  Mobility  Activity Ambulated with assistance in hallway  Level of Assistance Minimal assist, patient does 75% or more  Assistive Device Front wheel walker  Distance Ambulated (ft) 120 ft  Activity Response Tolerated fair  Mobility Referral Yes  $Mobility charge 1 Mobility  Mobility Specialist Start Time (ACUTE ONLY) 1510  Mobility Specialist Stop Time (ACUTE ONLY) 1535  Mobility Specialist Time Calculation (min) (ACUTE ONLY) 25 min   Pt agreeable to mobility. Required MinA for STS and throughout session due to L knee buckling, L foot drop and ongoing fatigue. Pt began ambulation on 4LO2 and increased to 5LO2 due to displayed SOB and SPO2 dropping to 82%. C/o lightheadedness and dizziness at end of session, BP taken, VSS. Pt left on 5L/min. In bed with all needs met and call bell in reach. Bed alarm on. RN notified.  Post-mobility: BP 155/89, 5LO2 SPO2 90%  Caesar Bookman Mobility Specialist Please contact via Special educational needs teacher or [employer] [phone]

## 2023-07-22 NOTE — Progress Notes (Signed)
Progress Note   Patient: [name] [id number]:[id number] DOB: Oct 11, 1961 DOA: 07/20/2023     2 DOS: the patient was seen and examined on 07/22/2023   Brief hospital course: Mr. Bent was admitted to the hospital with the working diagnosis of heart failure exacerbation.   62 yo male with the past medical history of heart failure, COPD, chronic hypoxia, hypertension, and arthritis who presented with dyspnea and chest pain. Recent hospitalization 09/25 to 07/15/23 at [facility] with diagnosed with pneumonia, COPD and pericardial effusion. He was medically treated and discharged home. He returned with chest pain and dyspnea to the ED on 10/06, positive wheezing and coughing. Because persistent pericardial effusion he was transferred from [facility] to [facility] ED for further evaluation.     Na 137, K 4,3 Cl 97, bicarbonate 30, glucose 155, bun 25 cr 1,23  Wbc 19,0 hgb 11.2 plt 490   Chest radiograph with cardiomegaly, with opacity on the left side, possible combination of atelectasis and soft tissue.   CT chest with large pericardial effusion with diffuse pericardial thickening, possible acute pericarditis.  Negative for pulmonary embolism.  Mild bronchial wall thickening and scattered mucous plugging.  Per my interpretation positive emphysematous changes. Small pleural effusion on the left with positive ascites.   EKG 92 bpm, normal axis, normal intervals, sinus rhythm with PVC, no significant ST segment or T wave changes.   Patient was placed on furosemide, bronchodilator therapy and steroids.  Colchicine.   Assessment and Plan: * COPD with acute exacerbation (HCC) Wheezing has improved from yesterday but not yet at baseline.   Plan to continue aggressive bronchodilator therapy. Inhaled and systemic corticosteroids.  Airway clearing techniques with flutter valve and incentive spirometer.  Antitussive agents. PT and OT.   Acute on chronic diastolic CHF (congestive heart failure)  (HCC) Echocardiogram with EF 60 to 65%, moderate concentric LVH, RV systolic function preserved, moderate to large pericardial effusion, circumferential with no signs of tamponade, normal size left and right atriums, no significant valvular disease.   Urine output is 1,575 ml Systolic blood pressure is 137 to 152 mmHg.   Plan to continue diuresis with oral furosemide.  Possible addition of SGLT 2 inh and spironolactone.   Suspected pericarditis, will continue with colchicine.  Ideally will avoid steroids but considering significant COPD exacerbation will continue steroids for now.  Check ESR and CRP.   Acute kidney injury superimposed on chronic kidney disease (HCC) CKD stage 3a.  Improved volume status, renal function today with serum cr at 1,8 with K at 4,0 and serum bicarbonate at 36, Na 137   Continue with oral furosemide.  Follow up renal function in am.   Sleep apnea in adult Cpap  Obesity, class 2 Calculated BMI is 38,2         Subjective: Patient with improvement in dyspnea but not back to baseline, chest pain has improved.   Physical Exam: Vitals:   07/22/23 0420 07/22/23 0756 07/22/23 0919 07/22/23 1203  BP: (!) 143/94 (!) 137/93  (!) 152/89  Pulse: 90 81  92  Resp: 19 15  15   Temp: 98.2 F (36.8 C) 98 F (36.7 C)  98.2 F (36.8 C)  TempSrc: Oral Oral  Oral  SpO2: 91% 92% 95% 90%  Weight: 112.5 kg     Height:       Neurology awake and alert, he has chronic weakness on the left side sp CVA in the past ENT with no pallor Cardiovascular with S1 and S2 present  and regular with no gallops, rubs or murmurs Respiratory with prolonged expiratory phase, positive expiratory wheezing with no scattered rhonchi and rales Abdomen with no distention  No lower extremity edema  Data Reviewed:    Family Communication: no family at the bedside   Disposition: Status is: Inpatient Remains inpatient appropriate because: systemic steroids   Planned Discharge  Destination: Home     Author: Coralie Keens, MD 07/22/2023 12:12 PM  For on call review [email or web address].

## 2023-07-22 NOTE — Progress Notes (Signed)
Pt refused Cpap for the night 

## 2023-07-22 NOTE — TOC Initial Note (Signed)
Transition of Care [facility]) - Initial/Assessment Note    Patient Details  Name: [name] MRN: [id number] Date of Birth: [birth year]  Transition of [facility]) CM/SW Contact:    Kenneth Sabal, RN Phone Number: 07/22/2023, 11:11 AM  Clinical Narrative:                  Spoke w patient at bedside he states that he lives at home w a roommate. He states that he has home oxygen, he cold not name the provider. He states that he will have a sister provide transportation for him home when [facility] and I reminded him that his sister will need to go by his home and get portable oxygen for the ride.  TOC will continue to follow  Expected Discharge Plan: Home/Self Care     Patient Goals and CMS Choice            Expected Discharge Plan and Services                                              Prior Living Arrangements/Services                       Activities of Daily Living   ADL Screening (condition at time of admission) Independently performs ADLs?: Yes (appropriate for developmental age) Is the patient deaf or have difficulty hearing?: No Does the patient have difficulty seeing, even when wearing glasses/contacts?: No Does the patient have difficulty concentrating, remembering, or making decisions?: No  Permission Sought/Granted                  Emotional Assessment              Admission diagnosis:  Shortness of breath [R06.02] Acute heart failure with preserved ejection fraction (HFpEF) (HCC) [I50.31] Patient Active Problem List   Diagnosis Date Noted   Obesity, class 2 07/21/2023   COPD with acute exacerbation (HCC) 07/20/2023   Acute on chronic diastolic CHF (congestive heart failure) (HCC) 07/20/2023   Sleep apnea in adult 07/20/2023   Pericardial effusion without cardiac tamponade 07/20/2023   Acute kidney injury superimposed on chronic kidney disease (HCC) 07/20/2023   Right adrenal mass (HCC) 09/13/2016   PCP:  Kenneth Rakers,  MD Pharmacy:   [facility] [phone] - [place], [place] - [address] [place] [address] Phone: [phone] Fax: [phone]     Social Determinants of Health (SDOH) Social History: SDOH Screenings   Food Insecurity: No Food Insecurity (07/20/2023)  Housing: Low Risk  (07/20/2023)  Transportation Needs: No Transportation Needs (07/20/2023)  Utilities: Not At Risk (07/20/2023)  Tobacco Use: Medium Risk (07/20/2023)   SDOH Interventions:     Readmission Risk Interventions     No data to display

## 2023-07-22 NOTE — Progress Notes (Signed)
   Patient Name: [name] Date of Encounter: 07/22/2023 [facility] Cardiologist: None   Interval Summary  .    Patient reports feeling wheezy this AM. HE also has a pain in the left side of his abdomen when he coughs. No chest pain. He found out this morning that his cousin passed away last night.   Vital Signs .    Vitals:   07/22/23 0000 07/22/23 0420 07/22/23 0756 07/22/23 0919  BP: (!) 150/96 (!) 143/94 (!) 137/93   Pulse: 95 90 81   Resp: 18 19 15    Temp: 97.6 F (36.4 C) 98.2 F (36.8 C) 98 F (36.7 C)   TempSrc: Oral Oral Oral   SpO2: 90% 91% 92% 95%  Weight:  112.5 kg    Height:        Intake/Output Summary (Last 24 hours) at 07/22/2023 1016 Last data filed at 07/22/2023 0904 Gross per 24 hour  Intake 797 ml  Output 1550 ml  Net -753 ml      07/22/2023    4:20 AM 07/21/2023    4:14 AM 07/20/2023    8:52 PM  Last 3 Weights  Weight (lbs) 248 lb 0.3 oz 240 lb 11.9 oz 241 lb 6.5 oz  Weight (kg) 112.5 kg 109.2 kg 109.5 kg      Telemetry/ECG    NSR, HR in the 90s  - Personally Reviewed  Physical Exam .   GEN: No acute distress.  Laying flat in the bed with head elevated  Neck: No JVD Cardiac: RRR, no murmurs, rubs, or gallops.  Respiratory: Inspiratory and expiratory wheezing heard throughout. Normal work of breathing on [place]  GI: Soft, nontender, non-distended  MS: No edema in BLE   Assessment & Plan .     Moderate-large pericardial effusion  Acute on chronic HFpEF - Echocardiogram this admission showed EF 60-65%, no regional wall motion abnormalities, moderate LVH, normal RV function, moderate-larrge pericardial effusion without evidence of cardiac tamponade  - Possible that pericardial effusion is inflammatory- continue colchicine - Has been on IV lasix 40 mg - output 1.6 L urine yesterday and is net -1.8 L since admission. Renal function stable  - He is euvolemic on exam today. Transition to PO lasix 40 mg daily  - Plan for outpatient  limited echocardiogram in 4-6 weeks to reassess pericardial effusion   Otherwise per primary  - COPD with acute exacerbation  - Obesity  - AKI on CKD   For questions or updates, please contact [facility] Please consult [email or web address] for contact info under        Signed, Jonita Albee, PA-C

## 2023-07-22 NOTE — Progress Notes (Signed)
Heart Failure Navigator Progress Note  Assessed for Heart & Vascular TOC clinic readiness.  Patient does not meet criteria due to admission for COPD, no HF TOC per Dr. Ella Jubilee, EF 60-65%.   Navigator will sign off at this time.   Rhae Hammock, BSN, Scientist, clinical (histocompatibility and immunogenetics) Only

## 2023-07-23 ENCOUNTER — Other Ambulatory Visit: Payer: Self-pay | Admitting: Cardiology

## 2023-07-23 DIAGNOSIS — J441 Chronic obstructive pulmonary disease with (acute) exacerbation: Secondary | ICD-10-CM | POA: Diagnosis not present

## 2023-07-23 DIAGNOSIS — I3139 Other pericardial effusion (noninflammatory): Secondary | ICD-10-CM

## 2023-07-23 DIAGNOSIS — G4730 Sleep apnea, unspecified: Secondary | ICD-10-CM | POA: Diagnosis not present

## 2023-07-23 DIAGNOSIS — I5033 Acute on chronic diastolic (congestive) heart failure: Secondary | ICD-10-CM | POA: Diagnosis not present

## 2023-07-23 DIAGNOSIS — N179 Acute kidney failure, unspecified: Secondary | ICD-10-CM | POA: Diagnosis not present

## 2023-07-23 LAB — BASIC METABOLIC PANEL
Anion gap: 7 (ref 5–15)
BUN: 31 mg/dL — ABNORMAL HIGH (ref 6–20)
CO2: 34 mmol/L — ABNORMAL HIGH (ref 22–32)
Calcium: 8.6 mg/dL — ABNORMAL LOW (ref 8.9–10.3)
Chloride: 98 mmol/L (ref 98–111)
Creatinine, Ser: 1.14 mg/dL (ref 0.61–1.24)
GFR, Estimated: >60 mL/min (ref >60)
Glucose, Bld: 196 mg/dL — ABNORMAL HIGH (ref 70–99)
Potassium: 4.2 mmol/L (ref 3.5–5.1)
Sodium: 139 mmol/L (ref 135–145)

## 2023-07-23 LAB — CBC WITH DIFFERENTIAL/PLATELET
Abs Immature Granulocytes: 0.32 10*3/uL — ABNORMAL HIGH (ref 0.00–0.07)
Basophils Absolute: 0 10*3/uL (ref 0.0–0.1)
Basophils Relative: 0 %
Eosinophils Absolute: 0 10*3/uL (ref 0.0–0.5)
Eosinophils Relative: 0 %
HCT: 34 % — ABNORMAL LOW (ref 39.0–52.0)
Hemoglobin: 10.5 g/dL — ABNORMAL LOW (ref 13.0–17.0)
Immature Granulocytes: 2 %
Lymphocytes Relative: 4 %
Lymphs Abs: 0.8 10*3/uL (ref 0.7–4.0)
MCH: 26.1 pg (ref 26.0–34.0)
MCHC: 30.9 g/dL (ref 30.0–36.0)
MCV: 84.6 fL (ref 80.0–100.0)
Monocytes Absolute: 0.8 10*3/uL (ref 0.1–1.0)
Monocytes Relative: 4 %
Neutro Abs: 19.3 10*3/uL — ABNORMAL HIGH (ref 1.7–7.7)
Neutrophils Relative %: 90 %
Platelets: 449 10*3/uL — ABNORMAL HIGH (ref 150–400)
RBC: 4.02 MIL/uL — ABNORMAL LOW (ref 4.22–5.81)
RDW: 16.2 % — ABNORMAL HIGH (ref 11.5–15.5)
WBC: 21.2 10*3/uL — ABNORMAL HIGH (ref 4.0–10.5)
nRBC: 0 % (ref 0.0–0.2)

## 2023-07-23 LAB — C-REACTIVE PROTEIN: CRP: 9.3 mg/dL — ABNORMAL HIGH (ref <1.0)

## 2023-07-23 LAB — SEDIMENTATION RATE: Sed Rate: 90 mm/h — ABNORMAL HIGH (ref 0–16)

## 2023-07-23 MED ORDER — METHYLPREDNISOLONE SODIUM SUCC 40 MG IJ SOLR
40.0000 mg | Freq: Two times a day (BID) | INTRAMUSCULAR | Status: DC
  Filled 2023-07-23: qty 1

## 2023-07-23 MED ORDER — METHYLPREDNISOLONE SODIUM SUCC 40 MG IJ SOLR
40.0000 mg | Freq: Two times a day (BID) | INTRAMUSCULAR | Status: DC
  Administered 2023-07-23 – 2023-07-24 (×2): 40 mg via INTRAVENOUS
  Filled 2023-07-23 (×2): qty 1

## 2023-07-23 MED ORDER — METHYLPREDNISOLONE SODIUM SUCC 125 MG IJ SOLR: 80.0000 mg | Freq: Every day | INTRAMUSCULAR | Status: DC

## 2023-07-23 MED ORDER — EMPAGLIFLOZIN 10 MG PO TABS
10.0000 mg | ORAL_TABLET | Freq: Every day | ORAL | Status: DC
  Administered 2023-07-23 – 2023-07-26 (×4): 10 mg via ORAL
  Filled 2023-07-23 (×4): qty 1

## 2023-07-23 MED ORDER — GUAIFENESIN-DM 100-10 MG/5ML PO SYRP
5.0000 mL | ORAL_SOLUTION | Freq: Four times a day (QID) | ORAL | Status: DC
  Administered 2023-07-23 – 2023-07-26 (×11): 5 mL via ORAL
  Filled 2023-07-23 (×12): qty 5

## 2023-07-23 NOTE — Progress Notes (Signed)
PROGRESS NOTE    [name]  VFI:[id number] DOB: 1961-01-14 DOA: 07/20/2023 PCP: Renaye Rakers, MD   [name] w diastolic CHF, COPD, chronic hypoxia, hypertension, and arthritis who presented with dyspnea and chest pain. Recent hospitalization 9/25-10/1 at [facility] with pneumonia, COPD and pericardial effusion. He was medically treated and discharged home. He returned with chest pain and dyspnea to the ED on 10/06, positive wheezing and coughing. Because persistent pericardial effusion he was transferred from [facility] to [facility] ED for further evaluation.    -bun 25 cr 1,23, Wbc 19,0 hgb 11.2 plt 490  -CXR w cardiomegaly, with opacity on the left side, combination of atelectasis and soft tissue.   -CT chest with large pericardial effusion with diffuse pericardial thickening, possible acute pericarditis, Mild bronchial wall thickening and scattered mucous plugging. Small pleural effusion on the left with positive ascites.  -Cards consulted, placed on furosemide, bronchodilator therapy and steroids.  Colchicine.    Subjective:  Assessment and Plan:  COPD with acute exacerbation (HCC) Wheezing has improved from yesterday but not yet at baseline.  -continue aggressive bronchodilator therapy. Inhaled and systemic corticosteroids.  Airway clearing techniques with flutter valve and incentive spirometer.  Antitussive agents. PT and OT.   Acute on chronic diastolic CHF (congestive heart failure) (HCC) -Echo EF 60 to 65%, moderate concentric LVH, RV systolic function preserved, moderate to large pericardial effusion, circumferential with no signs of tamponade,  -continue diuresis with oral furosemide.  Possible addition of SGLT 2 inh and spironolactone.   Pericardial effusion Suspected pericarditis, -Cards following, continue colchicine.  Ideally will avoid steroids but considering significant COPD exacerbation will continue steroids for now.  -ESR and CRP significantly elevated -will  check ANA too -needs Rheum FU  Acute kidney injury superimposed on chronic kidney disease (HCC) CKD stage 3a. -Improved volume status, renal function today with serum cr at 1,8  -continue  oral furosemide.  Follow up renal function in am.   Sleep apnea in adult Cpap  Obesity, class 2 Calculated BMI is 38,2   DVT prophylaxis: hep SQ Code Status: Full Code Family Communication: Disposition Plan:   Consultants:    Procedures:   Antimicrobials:    Objective: Vitals:   07/23/23 0039 07/23/23 0411 07/23/23 0732 07/23/23 1500  BP: (!) 157/95 (!) 156/91 (!) 146/87   Pulse: 87 (!) 107 89   Resp: 18 20 16    Temp: 98.2 F (36.8 C) 97.9 F (36.6 C) 98.3 F (36.8 C)   TempSrc: Oral Oral Oral   SpO2: 90% 90% 93% 94%  Weight:  113.6 kg    Height:        Intake/Output Summary (Last 24 hours) at 07/23/2023 1605 Last data filed at 07/23/2023 1316 Gross per 24 hour  Intake 1702 ml  Output 2250 ml  Net -548 ml   Filed Weights   07/21/23 0414 07/22/23 0420 07/23/23 0411  Weight: 109.2 kg 112.5 kg 113.6 kg    Examination:      Data Reviewed:   CBC: Recent Labs  Lab 07/20/23 2117 07/23/23 0333  WBC 19.0* 21.2*  NEUTROABS  --  19.3*  HGB 11.2* 10.5*  HCT 35.9* 34.0*  MCV 83.1 84.6  PLT 490* 449*   Basic Metabolic Panel: Recent Labs  Lab 07/20/23 2117 07/21/23 0314 07/22/23 0315 07/23/23 0333  NA  --  137 137 139  K  --  4.3 4.0 4.2  CL  --  97* 95* 98  CO2  --  30 36* 34*  GLUCOSE  --  155* 154* 196*  BUN  --  25* 32* 31*  CREATININE 1.32* 1.23 1.18 1.14  CALCIUM  --  8.3* 8.3* 8.6*  MG  --  2.0 2.1  --    GFR: Estimated Creatinine Clearance: 82.3 mL/min (by C-G formula based on SCr of 1.14 mg/dL). Liver Function Tests: No results for input(s): "AST", "ALT", "ALKPHOS", "BILITOT", "PROT", "ALBUMIN" in the last 168 hours. No results for input(s): "LIPASE", "AMYLASE" in the last 168 hours. No results for input(s): "AMMONIA" in the last 168  hours. Coagulation Profile: No results for input(s): "INR", "PROTIME" in the last 168 hours. Cardiac Enzymes: No results for input(s): "CKTOTAL", "CKMB", "CKMBINDEX", "TROPONINI" in the last 168 hours. BNP (last 3 results) No results for input(s): "PROBNP" in the last 8760 hours. HbA1C: No results for input(s): "HGBA1C" in the last 72 hours. CBG: No results for input(s): "GLUCAP" in the last 168 hours. Lipid Profile: No results for input(s): "CHOL", "HDL", "LDLCALC", "TRIG", "CHOLHDL", "LDLDIRECT" in the last 72 hours. Thyroid Function Tests: No results for input(s): "TSH", "T4TOTAL", "FREET4", "T3FREE", "THYROIDAB" in the last 72 hours. Anemia Panel: No results for input(s): "VITAMINB12", "FOLATE", "FERRITIN", "TIBC", "IRON", "RETICCTPCT" in the last 72 hours. Urine analysis:    Component Value Date/Time   COLORURINE YELLOW 03/14/2008 1652   APPEARANCEUR CLEAR 03/14/2008 1652   LABSPEC 1.025 03/14/2008 1652   PHURINE 5.5 03/14/2008 1652   GLUCOSEU NEGATIVE 03/14/2008 1652   HGBUR NEGATIVE 03/14/2008 1652   BILIRUBINUR NEGATIVE 03/14/2008 1652   KETONESUR NEGATIVE 03/14/2008 1652   PROTEINUR NEGATIVE 03/14/2008 1652   UROBILINOGEN 1.0 03/14/2008 1652   NITRITE NEGATIVE 03/14/2008 1652   LEUKOCYTESUR  03/14/2008 1652    NEGATIVE MICROSCOPIC NOT DONE ON URINES WITH NEGATIVE PROTEIN, BLOOD, LEUKOCYTES, NITRITE, OR GLUCOSE <1000 mg/dL.   Sepsis Labs: @LABRCNTIP (procalcitonin:4,lacticidven:4)  )No results found for this or any previous visit (from the past 240 hour(s)).   Radiology Studies: DG Chest 2 View  Result Date: 07/21/2023 CLINICAL DATA:  Dyspnea EXAM: CHEST - 2 VIEW COMPARISON:  06/23/2023, CT 07/20/2023 FINDINGS: Low lung volumes. Interval diffuse opacity in the left thorax, suspect combination of pleural effusion and airspace disease. Cardiomediastinal silhouette is obscured. No pneumothorax IMPRESSION: Interval diffuse opacity in the left thorax, suspect combination  of pleural effusion and airspace disease. Electronically Signed   By: Jasmine Pang M.D.   On: 07/21/2023 23:31     Scheduled Meds:  colchicine  0.6 mg Oral BID   furosemide  40 mg Oral Daily   heparin  5,000 Units Subcutaneous Q8H   ipratropium-albuterol  3 mL Nebulization Q6H   [START ON 07/24/2023] methylPREDNISolone (SOLU-MEDROL) injection  80 mg Intravenous Daily   methylPREDNISolone (SOLU-MEDROL) injection  40 mg Intravenous Q12H   mometasone-formoterol  2 puff Inhalation BID   umeclidinium bromide  1 puff Inhalation Daily   Continuous Infusions:   LOS: 3 days    Time spent:    Zannie Cove, MD [facility]   07/23/2023, 4:05 PM

## 2023-07-23 NOTE — Progress Notes (Signed)
Mobility Specialist Progress Note:    07/23/23 1049  Mobility  Activity Ambulated with assistance in hallway  Level of Assistance Minimal assist, patient does 75% or more  Assistive Device Front wheel walker  Distance Ambulated (ft) 80 ft  Activity Response Tolerated well  Mobility Referral Yes  $Mobility charge 1 Mobility  Mobility Specialist Start Time (ACUTE ONLY) 1049  Mobility Specialist Stop Time (ACUTE ONLY) 1108  Mobility Specialist Time Calculation (min) (ACUTE ONLY) 19 min   Pt received in chair, agreeable to mobility session. MinA required to stand and ambulate with RW. Ambulated in hallway, required few cues for gait correction. Baseline SpO2 94% on 3L. Near end of session, SpO2 76% on 3L. Took standing rest break, increased O2 flow, SpO2 91% on 6L. Returned pt to room, all needs met. Left with OT in room.   Feliciana Rossetti Mobility Specialist Please contact via Special educational needs teacher or  Rehab office at [phone]

## 2023-07-23 NOTE — Progress Notes (Signed)
Progress Note   Patient: [name] YQM:[id number] DOB: 05-04-1961 DOA: 07/20/2023     3 DOS: the patient was seen and examined on 07/23/2023   Brief hospital course: Mr. Fenstermaker was admitted to the hospital with the working diagnosis of heart failure exacerbation.   62 yo male with the past medical history of heart failure, COPD, chronic hypoxia, hypertension, and arthritis who presented with dyspnea and chest pain. Recent hospitalization 09/25 to 07/15/23 at [facility] with diagnosed with pneumonia, COPD and pericardial effusion. He was medically treated and discharged home. He returned with chest pain and dyspnea to the ED on 10/06, positive wheezing and coughing. Because persistent pericardial effusion he was transferred from [facility] to [facility] ED for further evaluation.     Na 137, K 4,3 Cl 97, bicarbonate 30, glucose 155, bun 25 cr 1,23  Wbc 19,0 hgb 11.2 plt 490   Chest radiograph with cardiomegaly, with opacity on the left side, possible combination of atelectasis and soft tissue.   CT chest with large pericardial effusion with diffuse pericardial thickening, possible acute pericarditis.  Negative for pulmonary embolism.  Mild bronchial wall thickening and scattered mucous plugging.  Per my interpretation positive emphysematous changes. Small pleural effusion on the left with positive ascites.   EKG 92 bpm, normal axis, normal intervals, sinus rhythm with PVC, no significant ST segment or T wave changes.   Patient was placed on furosemide, bronchodilator therapy and steroids.  Colchicine.  10/09 patient has been on aggressive bronchodilator therapy and systemic corticosteroids, slowly improving his symptoms. Continue to have increased sputum production.  Assessment and Plan: * COPD with acute exacerbation (HCC) Slowly improving, but continue to have wheezing and increased sputum production.   Continue aggressive bronchodilator therapy. Inhaled and systemic  corticosteroids.  Airway clearing techniques with flutter valve and incentive spirometer.  Antitussive agents, change to scheduled.  PT and OT.   Reactive leukocytosis at 21, with 19,3 neutrophils, no eosinophilia.   Acute on chronic diastolic CHF (congestive heart failure) (HCC) Echocardiogram with EF 60 to 65%, moderate concentric LVH, RV systolic function preserved, moderate to large pericardial effusion, circumferential with no signs of tamponade, normal size left and right atriums, no significant valvular disease.   Urine output is 2,000  ml Systolic blood pressure is 155 to 142 mmHg.   Plan to continue diuresis with oral furosemide.  Add SGLT 2 inh if good toleration may add spironolactone.   Suspected pericarditis, will continue with colchicine.  Ideally will avoid steroids but considering significant COPD exacerbation will continue steroids for now.  CRP is 9,3 and sed rate 90.   Acute kidney injury superimposed on chronic kidney disease (HCC) CKD stage 3a.  Renal function with serum cr at 1,1 with K at 4,2 and serum bicarbonate at 34. Na 139   Continue with oral furosemide, add SGLT 2 inh Follow up renal function in am.   Sleep apnea in adult Cpap  Obesity, class 2 Calculated BMI is 38,2         Subjective: Patient with no chest pain, dyspnea is improving but continue to have increased mucous production   Physical Exam: Vitals:   07/23/23 0411 07/23/23 0732 07/23/23 1500 07/23/23 1546  BP: (!) 156/91 (!) 146/87  (!) 142/99  Pulse: (!) 107 89  96  Resp: 20 16  16   Temp: 97.9 F (36.6 C) 98.3 F (36.8 C)  98.2 F (36.8 C)  TempSrc: Oral Oral  Oral  SpO2: 90% 93% 94% 92%  Weight: 113.6 kg     Height:       Neurology awake and alert., chronic left sided weakness ENT with mild pallor Cardiovascular with S1 and S2 present and regular with no gallops, rubs or murmurs No JVD No lower extremity edema Respiratory with prolonged expiratory phase, positive  expiratory wheezing and scattered rhonchi with no rales Abdomen with no distention  Data Reviewed:    Family Communication: no family at the bedside   Disposition: Status is: Inpatient Remains inpatient appropriate because: COPD exacerbation   Planned Discharge Destination: Home      Author: Coralie Keens, MD 07/23/2023 5:39 PM  For on call review [email or web address].

## 2023-07-23 NOTE — Progress Notes (Signed)
   Chart reviewed - ESR and CRP remain quite elevated - on mg/kg steroids - would continue with long taper-  may need additional rheumatologic workup.  We will arrange for follow-up echo in 4-6 weeks. Continue daily oral lasix 40 mg. Continue colchicine as tolerated for minimum of 3 months.  [facility] will sign off.   Medication Recommendations:  as above Other recommendations (labs, testing, etc):  outpatient limited echo in 4-6 weeks Follow up as an outpatient:  Dr. Servando Salina or APP in 6-8 weeks   Chrystie Nose, MD, [facility], FACP  [place]  [facility]  Medical Director of the Advanced Lipid Disorders &  Cardiovascular Risk Reduction Clinic Diplomate of the American Board of Clinical Lipidology Attending Cardiologist  Direct Dial: [phone]  Fax: [phone]  Website:  [email or web address]

## 2023-07-23 NOTE — Progress Notes (Signed)
Physical Therapy Treatment Patient Details Name: [name] MRN: [id number] DOB: [birth year] Today's Date: 07/23/2023   History of Present Illness 62 y/o male transferred from [facility] to [facility] 07/20/23 after found to be tachycardic hypotensive with pericardial effusion. Prior admit 3 days prior to [facility] w/ pericardial effusion. PMHx: HFpEF, pericardial effusion, COPD, sleep apnea on chronic supplemental O2 (3L), HTN, arthritis, CKD, prior CVA w/ left sided weakness.    PT Comments  Pt in bed upon arrival and agreeable to PT session. Worked on gait training, dynamic balance, and LE strengthening in today's session. Pt improved in ability to perform bed mobility with less assistance needed. Upon sitting EOB, pt required rest break due to productive cough. Pt becomes dyspneic with activity, requiring standing rest breaks to maintain >90 sats. Pt continues to need CGA for RW management and cues for hand placement with transfers. Pt is progressing well towards goals. Acute PT to follow.      If plan is discharge home, recommend the following: A little help with walking and/or transfers;A little help with bathing/dressing/bathroom     Equipment Recommendations  None recommended by PT       Precautions / Restrictions Precautions Precautions: Fall;Other (comment) Precaution Comments: monitor O2, prior CVA with L weakness Restrictions Weight Bearing Restrictions: No     Mobility  Bed Mobility   Bed Mobility: Supine to Sit     Supine to sit: HOB elevated, Contact guard, Used rails     General bed mobility comments: pt sleeps in recliner at home, CGA for safety with movement    Transfers Overall transfer level: Needs assistance Equipment used: Rolling walker (2 wheels) Transfers: Sit to/from Stand Sit to Stand: Contact guard assist           General transfer comment: Pt rocks to build momentum to stand, CGA for [employer] as pt tends to pull RW posteriorly.  Multiple cues for hand placement with little carry over.    Ambulation/Gait Ambulation/Gait assistance: Contact guard assist Gait Distance (Feet): 20 Feet Assistive device: Rolling walker (2 wheels) Gait Pattern/deviations: Step-to pattern, Decreased step length - left, Decreased dorsiflexion - left, Wide base of support Gait velocity: dec     General Gait Details: increased medial lateral sway for pt to propel L LE forwards during swing phase, increased m/l movement with form deterioration with increased activity         Balance Overall balance assessment: Needs assistance Sitting-balance support: No upper extremity supported, Feet supported Sitting balance-Leahy Scale: Fair     Standing balance support: Bilateral upper extremity supported, During functional activity, Reliant on assistive device for balance Standing balance-Leahy Scale: Poor Standing balance comment: reliant on RW for stability       Cognition Arousal: Alert Behavior During Therapy: WFL for tasks assessed/performed Overall Cognitive Status: No family/caregiver present to determine baseline cognitive functioning      General Comments: multiple cues for proper movement, slow processing        Exercises General Exercises - Lower Extremity Hip Flexion/Marching: AROM, Standing, Both, 15 reps Other Exercises Other Exercises: serial STS w/ CGA and RW, x10 Other Exercises: lateral side-stepping, 8 feet x3 reps    General Comments General comments (skin integrity, edema, etc.): VSS on supplemental O2, >90 sats. HR increases to 110 BPM with activity.      Pertinent Vitals/Pain Pain Assessment Faces Pain Scale: Hurts a little bit Pain Location: back Pain Descriptors / Indicators: Aching Pain Intervention(s): Limited activity within patient's tolerance,  Monitored during session, Repositioned     PT Goals (current goals can now be found in the care plan section) Progress towards PT goals: Progressing  toward goals    Frequency    Min 1X/week       AM-PAC PT "6 Clicks" Mobility   Outcome Measure  Help needed turning from your back to your side while in a flat bed without using bedrails?: A Little Help needed moving from lying on your back to sitting on the side of a flat bed without using bedrails?: A Little Help needed moving to and from a bed to a chair (including a wheelchair)?: A Little Help needed standing up from a chair using your arms (e.g., wheelchair or bedside chair)?: A Little Help needed to walk in hospital room?: A Little Help needed climbing 3-5 steps with a railing? : A Lot 6 Click Score: 17    End of Session Equipment Utilized During Treatment: Gait belt;Oxygen Activity Tolerance: Patient tolerated treatment well Patient left: in bed;with call bell/phone within reach;with bed alarm set Nurse Communication: Mobility status PT Visit Diagnosis: Unsteadiness on feet (R26.81);Muscle weakness (generalized) (M62.81)     Time: 1429-1500 PT Time Calculation (min) (ACUTE ONLY): 31 min  Charges:    $Gait Training: 8-22 mins $Therapeutic Exercise: 8-22 mins PT General Charges $$ ACUTE PT VISIT: 1 Visit                     [name], PT, DPT Secure Chat Preferred  Rehab Office [phone]   [name] 07/23/2023, 3:15 PM

## 2023-07-23 NOTE — Progress Notes (Signed)
   07/23/23 2154  BiPAP/CPAP/SIPAP  $ Non-Invasive Home Ventilator  Initial  $ Face Mask Large  Yes  BiPAP/CPAP/SIPAP Pt Type Adult  BiPAP/CPAP/SIPAP Resmed  Mask Type Full face mask  Mask Size Large  IPAP 10 cmH20  EPAP 5 cmH2O  Pressure Support 5 cmH20  PEEP 5 cmH20  Flow Rate 3 lpm  Patient Home Equipment No  Auto Titrate No  Nasal massage performed Yes  CPAP/SIPAP surface wiped down Yes  Safety Check Completed by RT for Home Unit Yes, no issues noted

## 2023-07-23 NOTE — Plan of Care (Signed)
  Problem: Education: Goal: Knowledge of General Education information will improve Description: Including pain rating scale, medication(s)/side effects and non-pharmacologic comfort measures Outcome: Progressing   Problem: Clinical Measurements: Goal: Ability to maintain clinical measurements within normal limits will improve Outcome: Progressing Goal: Diagnostic test results will improve Outcome: Progressing   

## 2023-07-23 NOTE — Progress Notes (Signed)
RN unable to locate flutter valve in supply room. RN notified RT to tube one for the patient use. RT verbalize understanding.

## 2023-07-23 NOTE — Care Management Important Message (Signed)
Important Message  Patient Details  Name: [name] MRN: [id number] Date of Birth: Mar 26, 1961   Important Message Given:  Yes - Medicare IM     Dorena Bodo 07/23/2023, 2:37 PM

## 2023-07-23 NOTE — Progress Notes (Signed)
Occupational Therapy Treatment Patient Details Name: [name] MRN: [id number] DOB: [birth year] Today's Date: 07/23/2023   History of present illness 62 y/o male transferred from [facility] to [facility] 07/20/23 after found to be tachycardic hypotensive with pericardial effusion. Prior admit 3 days prior to [facility] w/ pericardial effusion. PMHx: HFpEF, pericardial effusion, COPD, sleep apnea on chronic supplemental O2 (3L), HTN, arthritis, CKD, prior CVA w/ left sided weakness.   OT comments  Pt progressing toward established OT goals; motivated to learn more about compensatory strategies for LB ADL. Needing increased time, education, and cues for implementation of use of compensatory techniques and AE. Pt with decr activity tolerance needing several rest breaks throughout. Continue to recommend HHOT to optimize safety and independence in ADL and IADL.       If plan is discharge home, recommend the following:  A little help with walking and/or transfers;A little help with bathing/dressing/bathroom;Assistance with cooking/housework;A lot of help with bathing/dressing/bathroom   Equipment Recommendations  None recommended by OT    Recommendations for Other Services Other (comment) (HH aide)    Precautions / Restrictions Precautions Precautions: Fall;Other (comment) Precaution Comments: monitor O2, prior CVA with L weakness Restrictions Weight Bearing Restrictions: No       Mobility Bed Mobility               General bed mobility comments: OOB in recliner on arrival    Transfers Overall transfer level: Needs assistance Equipment used: Rolling walker (2 wheels) Transfers: Sit to/from Stand Sit to Stand: Contact guard assist           General transfer comment: Pt rocks to build momentum to stand, CGA for [employer] as pt tends to pull RW posteriorly. Pt able to place L UE onto RW to grip     Balance Overall balance assessment: Needs  assistance Sitting-balance support: No upper extremity supported, Feet supported Sitting balance-Leahy Scale: Fair     Standing balance support: Bilateral upper extremity supported, During functional activity, Reliant on assistive device for balance Standing balance-Leahy Scale: Poor Standing balance comment: reliant on RW for stability                           ADL either performed or assessed with clinical judgement   ADL Overall ADL's : Needs assistance/impaired                     Lower Body Dressing: Moderate assistance;With adaptive equipment Lower Body Dressing Details (indicate cue type and reason): Pt unable to achieve figure 4 or bend over to feet; intsoduced AE, and pt able to remove sock with reacher with increased time and cues during new leanring. pt needing assist to don sock on sock aid as well as incresed time and cues to understand functional use of sock aid as well as shoe horn. Pt with good success with sock aid with increased time but poor functional use of shoe horn. Will cotninue to educate Toilet Transfer: Contact guard assist;Ambulation;Rolling walker (2 wheels)           Functional mobility during ADLs: Contact guard assist;Rolling walker (2 wheels);Cueing for sequencing;Cueing for safety      Extremity/Trunk Assessment Upper Extremity Assessment Upper Extremity Assessment: LUE deficits/detail LUE Deficits / Details: L weakness from prior CVA. able to lift UE, assist to hold items though impaired coordination noted; able to use while using sock aid to pull up sock appropriately, but poor  dexterity with attempt to don sock onto sock aid LUE Coordination: decreased fine motor;decreased gross motor   Lower Extremity Assessment Lower Extremity Assessment: Defer to PT evaluation        Vision   Vision Assessment?: No apparent visual deficits (wears glasses)   Perception     Praxis      Cognition Arousal: Alert Behavior During  Therapy: WFL for tasks assessed/performed Overall Cognitive Status: No family/caregiver present to determine baseline cognitive functioning                                 General Comments: Pt with slowed processing and benefitting from additional cueing during new learning during sesssion.        Exercises      Shoulder Instructions       General Comments poor activity tolerance. SpO2 as low as 85 needing up to 6L to recover (on 3L on arrival). Pt needs increased time during new learning    Pertinent Vitals/ Pain       Pain Assessment Pain Assessment: Faces Faces Pain Scale: Hurts a little bit Pain Location: back Pain Descriptors / Indicators: Aching Pain Intervention(s): Limited activity within patient's tolerance, Monitored during session  Home Living                                          Prior Functioning/Environment              Frequency  Min 1X/week        Progress Toward Goals  OT Goals(current goals can now be found in the care plan section)  Progress towards OT goals: Progressing toward goals  Acute Rehab OT Goals Patient Stated Goal: pt would like to have an aide to assist at home OT Goal Formulation: With patient Time For Goal Achievement: 08/04/23 Potential to Achieve Goals: Good ADL Goals Pt Will Perform Lower Body Bathing: with modified independence;sit to/from stand;with adaptive equipment;sitting/lateral leans Pt Will Perform Lower Body Dressing: with modified independence;sit to/from stand;sitting/lateral leans;with adaptive equipment Pt Will Transfer to Toilet: with modified independence;ambulating Pt/caregiver will Perform Home Exercise Program: Increased strength;Left upper extremity;Independently;With written HEP provided Additional ADL Goal #1: Pt to verbalize at least 3 energy conservation strategies to implement at home  Plan      Co-evaluation                 AM-PAC OT "6 Clicks" Daily  Activity     Outcome Measure   Help from another person eating meals?: A Little Help from another person taking care of personal grooming?: A Little Help from another person toileting, which includes using toliet, bedpan, or urinal?: A Little Help from another person bathing (including washing, rinsing, drying)?: A Lot Help from another person to put on and taking off regular upper body clothing?: A Lot Help from another person to put on and taking off regular lower body clothing?: A Lot 6 Click Score: 15    End of Session Equipment Utilized During Treatment: Gait belt;Rolling walker (2 wheels);Oxygen  OT Visit Diagnosis: Unsteadiness on feet (R26.81);Other abnormalities of gait and mobility (R26.89);Muscle weakness (generalized) (M62.81)   Activity Tolerance Patient tolerated treatment well   Patient Left with call bell/phone within reach;in chair;with chair alarm set   Nurse Communication Mobility status  Time: 1610-9604 OT Time Calculation (min): 20 min  Charges: OT General Charges $OT Visit: 1 Visit OT Treatments $Self Care/Home Management : 8-22 mins  Tyler Deis, OTR/L [facility] Acute Rehabilitation Office: [phone]   Myrla Halsted 07/23/2023, 1:02 PM

## 2023-07-24 DIAGNOSIS — J441 Chronic obstructive pulmonary disease with (acute) exacerbation: Secondary | ICD-10-CM | POA: Diagnosis not present

## 2023-07-24 LAB — CBC WITH DIFFERENTIAL/PLATELET
Abs Immature Granulocytes: 0.28 10*3/uL — ABNORMAL HIGH (ref 0.00–0.07)
Basophils Absolute: 0 10*3/uL (ref 0.0–0.1)
Basophils Relative: 0 %
Eosinophils Absolute: 0 10*3/uL (ref 0.0–0.5)
Eosinophils Relative: 0 %
HCT: 32 % — ABNORMAL LOW (ref 39.0–52.0)
Hemoglobin: 10.2 g/dL — ABNORMAL LOW (ref 13.0–17.0)
Immature Granulocytes: 2 %
Lymphocytes Relative: 4 %
Lymphs Abs: 0.7 10*3/uL (ref 0.7–4.0)
MCH: 27.1 pg (ref 26.0–34.0)
MCHC: 31.9 g/dL (ref 30.0–36.0)
MCV: 85.1 fL (ref 80.0–100.0)
Monocytes Absolute: 0.7 10*3/uL (ref 0.1–1.0)
Monocytes Relative: 4 %
Neutro Abs: 15.1 10*3/uL — ABNORMAL HIGH (ref 1.7–7.7)
Neutrophils Relative %: 90 %
Platelets: 386 10*3/uL (ref 150–400)
RBC: 3.76 MIL/uL — ABNORMAL LOW (ref 4.22–5.81)
RDW: 17.2 % — ABNORMAL HIGH (ref 11.5–15.5)
WBC: 16.8 10*3/uL — ABNORMAL HIGH (ref 4.0–10.5)
nRBC: 0 % (ref 0.0–0.2)

## 2023-07-24 LAB — BASIC METABOLIC PANEL
Anion gap: 8 (ref 5–15)
BUN: 34 mg/dL — ABNORMAL HIGH (ref 6–20)
CO2: 34 mmol/L — ABNORMAL HIGH (ref 22–32)
Calcium: 8.8 mg/dL — ABNORMAL LOW (ref 8.9–10.3)
Chloride: 97 mmol/L — ABNORMAL LOW (ref 98–111)
Creatinine, Ser: 1.2 mg/dL (ref 0.61–1.24)
GFR, Estimated: >60 mL/min (ref >60)
Glucose, Bld: 164 mg/dL — ABNORMAL HIGH (ref 70–99)
Potassium: 4.5 mmol/L (ref 3.5–5.1)
Sodium: 139 mmol/L (ref 135–145)

## 2023-07-24 LAB — MAGNESIUM: Magnesium: 2.1 mg/dL (ref 1.7–2.4)

## 2023-07-24 MED ORDER — LACTULOSE 10 GM/15ML PO SOLN
20.0000 g | Freq: Two times a day (BID) | ORAL | Status: DC
  Administered 2023-07-24: 20 g via ORAL
  Filled 2023-07-24 (×3): qty 30

## 2023-07-24 MED ORDER — PREDNISONE 20 MG PO TABS
50.0000 mg | ORAL_TABLET | Freq: Every day | ORAL | Status: DC
  Administered 2023-07-24 – 2023-07-26 (×3): 50 mg via ORAL
  Filled 2023-07-24 (×3): qty 1

## 2023-07-24 NOTE — Plan of Care (Signed)
  Problem: Education: Goal: Knowledge of General Education information will improve Description: Including pain rating scale, medication(s)/side effects and non-pharmacologic comfort measures Outcome: Progressing   Problem: Clinical Measurements: Goal: Will remain free from infection Outcome: Progressing   

## 2023-07-24 NOTE — Progress Notes (Addendum)
Mobility Specialist Progress Note:   07/24/23 1500  Mobility  Activity Ambulated with assistance in hallway  Level of Assistance Minimal assist, patient does 75% or more  Assistive Device Front wheel walker  Distance Ambulated (ft) 90 ft  Activity Response Tolerated well  Mobility Referral Yes  $Mobility charge 1 Mobility  Mobility Specialist Start Time (ACUTE ONLY) 1420  Mobility Specialist Stop Time (ACUTE ONLY) 1434  Mobility Specialist Time Calculation (min) (ACUTE ONLY) 14 min   Pre Mobility: 93 HR , 165/91 BP , 97% SpO2 2 L During Mobility: 100 HR , 92% SpO2 2 L Post Mobility: 98 HR , 94% SpO2 2 L  Pt received in bed, agreeable to mobility. MinA to stand. CG during ambulation. Prior to standing pt c/o SOB at rest. Nasal canal found to be slightly off of nose. SpO2 levels >90% on 2 L. Pt denied any SOB during ambulation. Pt displayed slight knee buckle requiring frequent breaks to recover. Pt also needing verbal cues to stay within RW. Pt able to ambulate back to room and left EOB as requested with call bell in reach and all needs met.   Leory Plowman  Mobility Specialist Please contact via [employer] office at [phone]

## 2023-07-25 DIAGNOSIS — J441 Chronic obstructive pulmonary disease with (acute) exacerbation: Secondary | ICD-10-CM | POA: Diagnosis not present

## 2023-07-25 LAB — BASIC METABOLIC PANEL
Anion gap: 13 (ref 5–15)
BUN: 33 mg/dL — ABNORMAL HIGH (ref 6–20)
CO2: 32 mmol/L (ref 22–32)
Calcium: 9.1 mg/dL (ref 8.9–10.3)
Chloride: 97 mmol/L — ABNORMAL LOW (ref 98–111)
Creatinine, Ser: 0.9 mg/dL (ref 0.61–1.24)
GFR, Estimated: >60 mL/min (ref >60)
Glucose, Bld: 101 mg/dL — ABNORMAL HIGH (ref 70–99)
Potassium: 4.5 mmol/L (ref 3.5–5.1)
Sodium: 142 mmol/L (ref 135–145)

## 2023-07-25 MED ORDER — AMLODIPINE BESYLATE 10 MG PO TABS
10.0000 mg | ORAL_TABLET | Freq: Every day | ORAL | Status: DC
  Administered 2023-07-25 – 2023-07-26 (×2): 10 mg via ORAL
  Filled 2023-07-25 (×2): qty 1

## 2023-07-25 MED ORDER — LACTULOSE 10 GM/15ML PO SOLN: 20.0000 g | Freq: Two times a day (BID) | ORAL | Status: DC | PRN

## 2023-07-25 NOTE — Progress Notes (Signed)
PROGRESS NOTE    [name]  HKV:[id number] DOB: [birth year] DOA: 07/20/2023 PCP: Renaye Rakers, MD   [name] w diastolic CHF, COPD, chronic hypoxia, hypertension, and arthritis who presented with dyspnea and chest pain. Recent hospitalization 9/25-10/1 at [facility] with pneumonia, COPD and pericardial effusion. He was medically treated and discharged home. He returned with chest pain and dyspnea to the ED on 10/06, positive wheezing and coughing. Because persistent pericardial effusion he was transferred from [facility] to [facility] ED for further evaluation.    -bun 25 cr 1,23, Wbc 19,0 hgb 11.2 plt 490  -CXR w cardiomegaly, with opacity on the left side, combination of atelectasis and soft tissue.   -CT chest with large pericardial effusion with diffuse pericardial thickening, possible acute pericarditis, Mild bronchial wall thickening and scattered mucous plugging. Small pleural effusion on the left with positive ascites.  -Cards consulted, placed on furosemide, bronchodilator therapy and steroids.  Colchicine.    Subjective: Feels better overall, breathing slowly improving, still on oxygen, wishes to stay 1 more day  Assessment and Plan:  COPD with acute exacerbation (HCC) -Improving, treated with IV steroids, bronchodilators, flutter valve, incentive spirometry and antitussives -Transitioned to prednisone, plan for slow taper in the setting of pericarditis and effusion -Increase activity, attempt to wean O2  Acute on chronic diastolic CHF (congestive heart failure) (HCC) -Echo EF 60 to 65%, moderate concentric LVH, RV systolic function preserved, moderate to large pericardial effusion, circumferential with no signs of tamponade,  -continue diuresis with oral furosemide.  -Started on Jardiance -Volume status much improved, 6 L negative  Pericardial effusion Suspected pericarditis, -Cards following, now on prednisone and colchicine  -ESR and CRP remain significantly elevated,   -Seen by cardiology recommended long taper of steroids and 3 months of colchicine  -Follow-up with cardiology in 1 month with repeat echo  -Will send rheumatology referral at discharge  Acute kidney injury superimposed on chronic kidney disease (HCC) CKD stage 3a. -Improved, continue oral furosemide  Sleep apnea in adult Cpap  Obesity, class 2 Calculated BMI is 38,2   DVT prophylaxis: hep SQ Code Status: Full Code Family Communication: None present Disposition Plan: Home tomorrow if stable  Consultants: Cardiology   Procedures:   Antimicrobials:    Objective: Vitals:   07/25/23 0825 07/25/23 0828 07/25/23 0832 07/25/23 1141  BP:    (!) 173/101  Pulse:    72  Resp:    20  Temp:    97.8 F (36.6 C)  TempSrc:    Oral  SpO2: 96% 97% 97% 94%  Weight:      Height:        Intake/Output Summary (Last 24 hours) at 07/25/2023 1233 Last data filed at 07/25/2023 1141 Gross per 24 hour  Intake 477 ml  Output 3300 ml  Net -2823 ml   Filed Weights   07/23/23 0411 07/24/23 0510 07/25/23 0434  Weight: 113.6 kg 115 kg 114.1 kg    Examination:  Gen: Obese chronically ill male sitting up in bed, AAOx3 HEENT: Neck obese unable to assess JVD CVS: S1-S2, regular rhythm Lungs: Improving air movement, no expiratory wheezes today Abdomen: Soft, nontender, bowel sounds present Extremities: No edema Skin: no new rashes on exposed skin     Data Reviewed:   CBC: Recent Labs  Lab 07/20/23 2117 07/23/23 0333 07/24/23 0329  WBC 19.0* 21.2* 16.8*  NEUTROABS  --  19.3* 15.1*  HGB 11.2* 10.5* 10.2*  HCT 35.9* 34.0* 32.0*  MCV 83.1 84.6 85.1  PLT 490* 449* 386   Basic Metabolic Panel: Recent Labs  Lab 07/21/23 0314 07/22/23 0315 07/23/23 0333 07/24/23 0329 07/25/23 0635  NA 137 137 139 139 142  K 4.3 4.0 4.2 4.5 4.5  CL 97* 95* 98 97* 97*  CO2 30 36* 34* 34* 32  GLUCOSE 155* 154* 196* 164* 101*  BUN 25* 32* 31* 34* 33*  CREATININE 1.23 1.18 1.14 1.20 0.90   CALCIUM 8.3* 8.3* 8.6* 8.8* 9.1  MG 2.0 2.1  --  2.1  --    GFR: Estimated Creatinine Clearance: 104.4 mL/min (by C-G formula based on SCr of 0.9 mg/dL). Liver Function Tests: No results for input(s): "AST", "ALT", "ALKPHOS", "BILITOT", "PROT", "ALBUMIN" in the last 168 hours. No results for input(s): "LIPASE", "AMYLASE" in the last 168 hours. No results for input(s): "AMMONIA" in the last 168 hours. Coagulation Profile: No results for input(s): "INR", "PROTIME" in the last 168 hours. Cardiac Enzymes: No results for input(s): "CKTOTAL", "CKMB", "CKMBINDEX", "TROPONINI" in the last 168 hours. BNP (last 3 results) No results for input(s): "PROBNP" in the last 8760 hours. HbA1C: No results for input(s): "HGBA1C" in the last 72 hours. CBG: No results for input(s): "GLUCAP" in the last 168 hours. Lipid Profile: No results for input(s): "CHOL", "HDL", "LDLCALC", "TRIG", "CHOLHDL", "LDLDIRECT" in the last 72 hours. Thyroid Function Tests: No results for input(s): "TSH", "T4TOTAL", "FREET4", "T3FREE", "THYROIDAB" in the last 72 hours. Anemia Panel: No results for input(s): "VITAMINB12", "FOLATE", "FERRITIN", "TIBC", "IRON", "RETICCTPCT" in the last 72 hours. Urine analysis:    Component Value Date/Time   COLORURINE YELLOW 03/14/2008 1652   APPEARANCEUR CLEAR 03/14/2008 1652   LABSPEC 1.025 03/14/2008 1652   PHURINE 5.5 03/14/2008 1652   GLUCOSEU NEGATIVE 03/14/2008 1652   HGBUR NEGATIVE 03/14/2008 1652   BILIRUBINUR NEGATIVE 03/14/2008 1652   KETONESUR NEGATIVE 03/14/2008 1652   PROTEINUR NEGATIVE 03/14/2008 1652   UROBILINOGEN 1.0 03/14/2008 1652   NITRITE NEGATIVE 03/14/2008 1652   LEUKOCYTESUR  03/14/2008 1652    NEGATIVE MICROSCOPIC NOT DONE ON URINES WITH NEGATIVE PROTEIN, BLOOD, LEUKOCYTES, NITRITE, OR GLUCOSE <1000 mg/dL.   Sepsis Labs: @LABRCNTIP (procalcitonin:4,lacticidven:4)  )No results found for this or any previous visit (from the past 240 hour(s)).   Radiology  Studies: No results found.   Scheduled Meds:  amLODipine  10 mg Oral Daily   colchicine  0.6 mg Oral BID   empagliflozin  10 mg Oral Daily   furosemide  40 mg Oral Daily   guaiFENesin-dextromethorphan  5 mL Oral QID   heparin  5,000 Units Subcutaneous Q8H   ipratropium-albuterol  3 mL Nebulization Q6H   lactulose  20 g Oral BID   mometasone-formoterol  2 puff Inhalation BID   predniSONE  50 mg Oral Q breakfast   umeclidinium bromide  1 puff Inhalation Daily   Continuous Infusions:   LOS: 5 days    Time spent:    Zannie Cove, MD [facility]   07/25/2023, 12:33 PM
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
## 2023-07-25 NOTE — Progress Notes (Signed)
PT Cancellation Note  Patient Details Name: [name] MRN: [id number] DOB: 1961/06/26   Cancelled Treatment:    Reason Eval/Treat Not Completed: Patient declined, no reason specified; reports his aunt died and just not feeling like he can participate.  RN aware. Will follow up.   [name] 07/25/2023, 2:02 PM [name], PT Acute Rehabilitation Services Office:[phone] 07/25/2023

## 2023-07-26 ENCOUNTER — Other Ambulatory Visit (HOSPITAL_COMMUNITY): Payer: Self-pay

## 2023-07-26 DIAGNOSIS — J441 Chronic obstructive pulmonary disease with (acute) exacerbation: Secondary | ICD-10-CM | POA: Diagnosis not present

## 2023-07-26 LAB — BASIC METABOLIC PANEL
Anion gap: 8 (ref 5–15)
BUN: 37 mg/dL — ABNORMAL HIGH (ref 6–20)
CO2: 31 mmol/L (ref 22–32)
Calcium: 8.8 mg/dL — ABNORMAL LOW (ref 8.9–10.3)
Chloride: 100 mmol/L (ref 98–111)
Creatinine, Ser: 1.06 mg/dL (ref 0.61–1.24)
GFR, Estimated: >60 mL/min (ref >60)
Glucose, Bld: 100 mg/dL — ABNORMAL HIGH (ref 70–99)
Potassium: 4.1 mmol/L (ref 3.5–5.1)
Sodium: 139 mmol/L (ref 135–145)

## 2023-07-26 MED ORDER — COLCHICINE 0.6 MG PO TABS
0.6000 mg | ORAL_TABLET | Freq: Two times a day (BID) | ORAL | 0 refills | Status: DC
  Filled 2023-07-26: qty 60, 30d supply, fill #0

## 2023-07-26 MED ORDER — EMPAGLIFLOZIN 10 MG PO TABS
10.0000 mg | ORAL_TABLET | Freq: Every day | ORAL | 0 refills | Status: AC
  Filled 2023-07-26: qty 30, 30d supply, fill #0

## 2023-07-26 MED ORDER — PREDNISONE 20 MG PO TABS
40.0000 mg | ORAL_TABLET | Freq: Every day | ORAL | 0 refills | Status: AC
  Filled 2023-07-26: qty 60, 30d supply, fill #0

## 2023-07-26 MED ORDER — FUROSEMIDE 40 MG PO TABS
40.0000 mg | ORAL_TABLET | Freq: Every day | ORAL | 0 refills | Status: AC
  Filled 2023-07-26: qty 30, 30d supply, fill #0

## 2023-07-26 MED ORDER — PANTOPRAZOLE SODIUM 40 MG PO TBEC
40.0000 mg | DELAYED_RELEASE_TABLET | Freq: Every day | ORAL | 0 refills | Status: AC
  Filled 2023-07-26: qty 30, 30d supply, fill #0

## 2023-07-26 MED ORDER — AMLODIPINE BESYLATE 10 MG PO TABS
10.0000 mg | ORAL_TABLET | Freq: Every day | ORAL | 0 refills | Status: DC
  Filled 2023-07-26: qty 30, 30d supply, fill #0

## 2023-07-26 NOTE — TOC Transition Note (Addendum)
Transition of Care [facility]) - CM/SW Discharge Note   Patient Details  Name: [name] MRN: [id number] Date of Birth: 1961-07-13  [facility] CM/SW Contact:  [name], RN Phone Number: 07/26/2023, 11:26 AM   Clinical Narrative:     Notified by [facility] that patient will need new HH orders, obtained, and notified of DC to home today, no other TOC needs identified for DC  Ordered [employer] to bring extra tank for home to room.   Final next level of care: Home w Home Health Services Barriers to Discharge: No Barriers Identified   Patient Goals and CMS Choice CMS Medicare.gov Compare Post Acute Care list provided to:: Patient Choice offered to / list presented to : Patient  Discharge Placement                         Discharge Plan and Services Additional resources added to the After Visit Summary for                              [facility] Agency: [facility] Date [facility] Agency Contacted: 07/26/23 Time HH Agency Contacted: 1126 Representative spoke with at [facility] Agency: Laurelyn Sickle  Social Determinants of Health (SDOH) Interventions SDOH Screenings   Food Insecurity: No Food Insecurity (07/20/2023)  Housing: Low Risk  (07/20/2023)  Transportation Needs: No Transportation Needs (07/20/2023)  Utilities: Not At Risk (07/20/2023)  Tobacco Use: Medium Risk (07/20/2023)     Readmission Risk Interventions     No data to display

## 2023-07-26 NOTE — Discharge Summary (Signed)
thickening. The effusion is low to intermediate density (Hounsfield units 30). Negative for acute pulmonary embolism. Normal caliber thoracic aorta. Aortic atherosclerotic calcification. Mediastinum/Nodes: Bowing of the posterior tracheal compatible with expiratory phase. Unremarkable esophagus. Right paratracheal lymphadenopathy measuring up to 1.3 cm. Lungs/Pleura: Mild bronchial wall thickening and scattered mucous plugging. Subtotal atelectasis in the left upper and left lower lobes. Mild atelectasis or scarring right lower lobe. No pleural effusion or pneumothorax. Upper Abdomen: No  acute abnormality. Musculoskeletal: No acute fracture. Review of the MIP images confirms the above findings. IMPRESSION: 1. Similar large pericardial effusion with diffuse pericardial thickening, similar to earlier today and concerning for acute pericarditis. 2. Negative for acute pulmonary embolism. 3. Mild bronchial wall thickening and scattered mucous plugging. Left-greater-than-right atelectasis. 4. Right paratracheal lymphadenopathy measuring up to 1.3 cm. Continued attention on follow-up Aortic Atherosclerosis (ICD10-I70.0). Electronically Signed   By: Minerva Fester M.D.   On: 07/20/2023 19:22   ECHOCARDIOGRAM COMPLETE  Result Date: 07/20/2023    ECHOCARDIOGRAM REPORT   Patient Name:   [name] Date of Exam: 07/20/2023 Medical Rec #:  [id number]       Height:       66.0 in Accession #:    [id number]      Weight:       240.0 lb Date of Birth:  1961-06-11       BSA:          2.161 m Patient Age:    61 years        BP:           114/79 mmHg Patient Gender: M               HR:           96 bpm. Exam Location:  Inpatient Procedure: 2D Echo, Cardiac Doppler and Color Doppler STAT ECHO Indications:    pericardial effusion  History:        Patient has prior history of Echocardiogram examinations. Risk                 Factors:Sleep Apnea and Hypertension.  Sonographer:    Delcie Roch RDCS Referring Phys: [id number] DAYNA N DUNN IMPRESSIONS  1. Left ventricular ejection fraction, by estimation, is 60 to 65%. The left ventricle has normal function. The left ventricle has no regional wall motion abnormalities. There is moderate concentric left ventricular hypertrophy. Left ventricular diastolic parameters are indeterminate.  2. Right ventricular systolic function is normal. The right ventricular size is normal. Tricuspid regurgitation signal is inadequate for assessing PA pressure.  3. Moderate to large. The pericardial effusion is circumferential. There is no evidence of cardiac tamponade.  4. The mitral valve  is normal in structure. No evidence of mitral valve regurgitation. No evidence of mitral stenosis.  5. The aortic valve has an indeterminant number of cusps. Aortic valve regurgitation is not visualized. No aortic stenosis is present.  6. The inferior vena cava is normal in size with greater than 50% respiratory variability, suggesting right atrial pressure of 3 mmHg. FINDINGS  Left Ventricle: Left ventricular ejection fraction, by estimation, is 60 to 65%. The left ventricle has normal function. The left ventricle has no regional wall motion abnormalities. The left ventricular internal cavity size was normal in size. There is  moderate concentric left ventricular hypertrophy. Left ventricular diastolic parameters are indeterminate. Right Ventricle: The right ventricular size is normal. No increase in right ventricular wall thickness. Right ventricular systolic function is normal. Tricuspid regurgitation signal  Physician Discharge Summary  [name] ZOX:[id number] DOB: [birth year] DOA: 07/20/2023  PCP: Renaye Rakers, MD  Admit date: 07/20/2023 Discharge date: 07/26/2023  Time spent: 45 minutes  Recommendations for Outpatient Follow-up:  Cards FU in 48month w/ repeat ECHO to assess pericardial effusion Rheum Referral sent for Pericardial effusion as well   Discharge Diagnoses:    Pericarditis   Pericardial effusion   COPD with acute exacerbation (HCC) Active Problems:   Acute on chronic diastolic CHF (congestive heart failure) (HCC)   Acute kidney injury superimposed on chronic kidney disease (HCC)   Sleep apnea in adult   Obesity, class 2   Discharge Condition: stable  Diet recommendation: Low sodium, heart healthy  Filed Weights   07/24/23 0510 07/25/23 0434 07/26/23 0424  Weight: 115 kg 114.1 kg 113.4 kg    History of present illness:  [name] w diastolic CHF, COPD, chronic hypoxia, hypertension, and arthritis who presented with dyspnea and chest pain. Recent hospitalization 9/25-10/1 at [facility] with pneumonia, COPD and pericardial effusion. He was medically treated and discharged home. He returned with chest pain and dyspnea to the ED on 10/06, positive wheezing and coughing. Because persistent pericardial effusion he was transferred from [facility] to [facility] ED for further evaluation.    -bun 25 cr 1,23, Wbc 19,0 hgb 11.2 plt 490  -CXR w cardiomegaly, with opacity on the left side, combination of atelectasis and soft tissue.   -CT chest with large pericardial effusion with diffuse pericardial thickening, possible acute pericarditis, Mild bronchial wall thickening and scattered mucous plugging. Small pleural effusion on the left with positive ascites.  Hospital Course:   Acute on chronic diastolic CHF (congestive heart failure) (HCC) -Echo EF 60 to 65%, moderate concentric LVH, RV systolic function preserved, moderate to large pericardial effusion, circumferential with no  signs of tamponade,  -diuresed with IV lasix, improved 7.6L negative, changed to Po -Started on Jardiance -FU with Cardiology, has appt in 1 month   Pericardial effusion Suspected pericarditis, -started on prednisone and colchicine  -ESR and CRP remain significantly elevated,  -cardiology recommended long taper of steroids and 3 months of colchicine  -Follow-up with cardiology in 1 month with repeat echo  -Will send rheumatology referral at discharge  COPD with acute exacerbation (HCC) -Improving, treated with IV steroids, bronchodilators, flutter valve, incentive spirometry and antitussives -Transitioned to prednisone, plan for slow taper in the setting of pericarditis and effusion   Acute kidney injury superimposed on chronic kidney disease (HCC) CKD stage 3a. -Improved, continue oral furosemide   Sleep apnea in adult Cpap   Obesity, class 2 Calculated BMI is 38,2   Discharge Exam: Vitals:   07/26/23 0932 07/26/23 1145  BP: 131/88 (!) 146/97  Pulse: 88   Resp:    Temp: (!) 97.4 F (36.3 C) 98 F (36.7 C)  SpO2: 90%    Gen: Obese chronically ill male sitting up in bed, AAOx3 HEENT: Neck obese unable to assess JVD CVS: S1-S2, regular rhythm Lungs: Improving air movement, no expiratory wheezes today Abdomen: Soft, nontender, bowel sounds present Extremities: No edema Skin: no new rashes on exposed skin   Discharge Instructions   Discharge Instructions     Diet - low sodium heart healthy   Complete by: As directed    Increase activity slowly   Complete by: As directed       Allergies as of 07/26/2023   No Known Allergies      Medication List     STOP taking these  thickening. The effusion is low to intermediate density (Hounsfield units 30). Negative for acute pulmonary embolism. Normal caliber thoracic aorta. Aortic atherosclerotic calcification. Mediastinum/Nodes: Bowing of the posterior tracheal compatible with expiratory phase. Unremarkable esophagus. Right paratracheal lymphadenopathy measuring up to 1.3 cm. Lungs/Pleura: Mild bronchial wall thickening and scattered mucous plugging. Subtotal atelectasis in the left upper and left lower lobes. Mild atelectasis or scarring right lower lobe. No pleural effusion or pneumothorax. Upper Abdomen: No  acute abnormality. Musculoskeletal: No acute fracture. Review of the MIP images confirms the above findings. IMPRESSION: 1. Similar large pericardial effusion with diffuse pericardial thickening, similar to earlier today and concerning for acute pericarditis. 2. Negative for acute pulmonary embolism. 3. Mild bronchial wall thickening and scattered mucous plugging. Left-greater-than-right atelectasis. 4. Right paratracheal lymphadenopathy measuring up to 1.3 cm. Continued attention on follow-up Aortic Atherosclerosis (ICD10-I70.0). Electronically Signed   By: Minerva Fester M.D.   On: 07/20/2023 19:22   ECHOCARDIOGRAM COMPLETE  Result Date: 07/20/2023    ECHOCARDIOGRAM REPORT   Patient Name:   [name] Date of Exam: 07/20/2023 Medical Rec #:  [id number]       Height:       66.0 in Accession #:    [id number]      Weight:       240.0 lb Date of Birth:  1961-06-11       BSA:          2.161 m Patient Age:    61 years        BP:           114/79 mmHg Patient Gender: M               HR:           96 bpm. Exam Location:  Inpatient Procedure: 2D Echo, Cardiac Doppler and Color Doppler STAT ECHO Indications:    pericardial effusion  History:        Patient has prior history of Echocardiogram examinations. Risk                 Factors:Sleep Apnea and Hypertension.  Sonographer:    Delcie Roch RDCS Referring Phys: 36 DAYNA N DUNN IMPRESSIONS  1. Left ventricular ejection fraction, by estimation, is 60 to 65%. The left ventricle has normal function. The left ventricle has no regional wall motion abnormalities. There is moderate concentric left ventricular hypertrophy. Left ventricular diastolic parameters are indeterminate.  2. Right ventricular systolic function is normal. The right ventricular size is normal. Tricuspid regurgitation signal is inadequate for assessing PA pressure.  3. Moderate to large. The pericardial effusion is circumferential. There is no evidence of cardiac tamponade.  4. The mitral valve  is normal in structure. No evidence of mitral valve regurgitation. No evidence of mitral stenosis.  5. The aortic valve has an indeterminant number of cusps. Aortic valve regurgitation is not visualized. No aortic stenosis is present.  6. The inferior vena cava is normal in size with greater than 50% respiratory variability, suggesting right atrial pressure of 3 mmHg. FINDINGS  Left Ventricle: Left ventricular ejection fraction, by estimation, is 60 to 65%. The left ventricle has normal function. The left ventricle has no regional wall motion abnormalities. The left ventricular internal cavity size was normal in size. There is  moderate concentric left ventricular hypertrophy. Left ventricular diastolic parameters are indeterminate. Right Ventricle: The right ventricular size is normal. No increase in right ventricular wall thickness. Right ventricular systolic function is normal. Tricuspid regurgitation signal  thickening. The effusion is low to intermediate density (Hounsfield units 30). Negative for acute pulmonary embolism. Normal caliber thoracic aorta. Aortic atherosclerotic calcification. Mediastinum/Nodes: Bowing of the posterior tracheal compatible with expiratory phase. Unremarkable esophagus. Right paratracheal lymphadenopathy measuring up to 1.3 cm. Lungs/Pleura: Mild bronchial wall thickening and scattered mucous plugging. Subtotal atelectasis in the left upper and left lower lobes. Mild atelectasis or scarring right lower lobe. No pleural effusion or pneumothorax. Upper Abdomen: No  acute abnormality. Musculoskeletal: No acute fracture. Review of the MIP images confirms the above findings. IMPRESSION: 1. Similar large pericardial effusion with diffuse pericardial thickening, similar to earlier today and concerning for acute pericarditis. 2. Negative for acute pulmonary embolism. 3. Mild bronchial wall thickening and scattered mucous plugging. Left-greater-than-right atelectasis. 4. Right paratracheal lymphadenopathy measuring up to 1.3 cm. Continued attention on follow-up Aortic Atherosclerosis (ICD10-I70.0). Electronically Signed   By: Minerva Fester M.D.   On: 07/20/2023 19:22   ECHOCARDIOGRAM COMPLETE  Result Date: 07/20/2023    ECHOCARDIOGRAM REPORT   Patient Name:   [name] Date of Exam: 07/20/2023 Medical Rec #:  [id number]       Height:       66.0 in Accession #:    [id number]      Weight:       240.0 lb Date of Birth:  1961-06-11       BSA:          2.161 m Patient Age:    61 years        BP:           114/79 mmHg Patient Gender: M               HR:           96 bpm. Exam Location:  Inpatient Procedure: 2D Echo, Cardiac Doppler and Color Doppler STAT ECHO Indications:    pericardial effusion  History:        Patient has prior history of Echocardiogram examinations. Risk                 Factors:Sleep Apnea and Hypertension.  Sonographer:    Delcie Roch RDCS Referring Phys: 36 DAYNA N DUNN IMPRESSIONS  1. Left ventricular ejection fraction, by estimation, is 60 to 65%. The left ventricle has normal function. The left ventricle has no regional wall motion abnormalities. There is moderate concentric left ventricular hypertrophy. Left ventricular diastolic parameters are indeterminate.  2. Right ventricular systolic function is normal. The right ventricular size is normal. Tricuspid regurgitation signal is inadequate for assessing PA pressure.  3. Moderate to large. The pericardial effusion is circumferential. There is no evidence of cardiac tamponade.  4. The mitral valve  is normal in structure. No evidence of mitral valve regurgitation. No evidence of mitral stenosis.  5. The aortic valve has an indeterminant number of cusps. Aortic valve regurgitation is not visualized. No aortic stenosis is present.  6. The inferior vena cava is normal in size with greater than 50% respiratory variability, suggesting right atrial pressure of 3 mmHg. FINDINGS  Left Ventricle: Left ventricular ejection fraction, by estimation, is 60 to 65%. The left ventricle has normal function. The left ventricle has no regional wall motion abnormalities. The left ventricular internal cavity size was normal in size. There is  moderate concentric left ventricular hypertrophy. Left ventricular diastolic parameters are indeterminate. Right Ventricle: The right ventricular size is normal. No increase in right ventricular wall thickness. Right ventricular systolic function is normal. Tricuspid regurgitation signal  thickening. The effusion is low to intermediate density (Hounsfield units 30). Negative for acute pulmonary embolism. Normal caliber thoracic aorta. Aortic atherosclerotic calcification. Mediastinum/Nodes: Bowing of the posterior tracheal compatible with expiratory phase. Unremarkable esophagus. Right paratracheal lymphadenopathy measuring up to 1.3 cm. Lungs/Pleura: Mild bronchial wall thickening and scattered mucous plugging. Subtotal atelectasis in the left upper and left lower lobes. Mild atelectasis or scarring right lower lobe. No pleural effusion or pneumothorax. Upper Abdomen: No  acute abnormality. Musculoskeletal: No acute fracture. Review of the MIP images confirms the above findings. IMPRESSION: 1. Similar large pericardial effusion with diffuse pericardial thickening, similar to earlier today and concerning for acute pericarditis. 2. Negative for acute pulmonary embolism. 3. Mild bronchial wall thickening and scattered mucous plugging. Left-greater-than-right atelectasis. 4. Right paratracheal lymphadenopathy measuring up to 1.3 cm. Continued attention on follow-up Aortic Atherosclerosis (ICD10-I70.0). Electronically Signed   By: Minerva Fester M.D.   On: 07/20/2023 19:22   ECHOCARDIOGRAM COMPLETE  Result Date: 07/20/2023    ECHOCARDIOGRAM REPORT   Patient Name:   [name] Date of Exam: 07/20/2023 Medical Rec #:  [id number]       Height:       66.0 in Accession #:    [id number]      Weight:       240.0 lb Date of Birth:  1961-06-11       BSA:          2.161 m Patient Age:    61 years        BP:           114/79 mmHg Patient Gender: M               HR:           96 bpm. Exam Location:  Inpatient Procedure: 2D Echo, Cardiac Doppler and Color Doppler STAT ECHO Indications:    pericardial effusion  History:        Patient has prior history of Echocardiogram examinations. Risk                 Factors:Sleep Apnea and Hypertension.  Sonographer:    Delcie Roch RDCS Referring Phys: 36 DAYNA N DUNN IMPRESSIONS  1. Left ventricular ejection fraction, by estimation, is 60 to 65%. The left ventricle has normal function. The left ventricle has no regional wall motion abnormalities. There is moderate concentric left ventricular hypertrophy. Left ventricular diastolic parameters are indeterminate.  2. Right ventricular systolic function is normal. The right ventricular size is normal. Tricuspid regurgitation signal is inadequate for assessing PA pressure.  3. Moderate to large. The pericardial effusion is circumferential. There is no evidence of cardiac tamponade.  4. The mitral valve  is normal in structure. No evidence of mitral valve regurgitation. No evidence of mitral stenosis.  5. The aortic valve has an indeterminant number of cusps. Aortic valve regurgitation is not visualized. No aortic stenosis is present.  6. The inferior vena cava is normal in size with greater than 50% respiratory variability, suggesting right atrial pressure of 3 mmHg. FINDINGS  Left Ventricle: Left ventricular ejection fraction, by estimation, is 60 to 65%. The left ventricle has normal function. The left ventricle has no regional wall motion abnormalities. The left ventricular internal cavity size was normal in size. There is  moderate concentric left ventricular hypertrophy. Left ventricular diastolic parameters are indeterminate. Right Ventricle: The right ventricular size is normal. No increase in right ventricular wall thickness. Right ventricular systolic function is normal. Tricuspid regurgitation signal

## 2023-07-26 NOTE — Progress Notes (Signed)
Handed patient TOC medications. Patient aware.

## 2023-07-26 NOTE — Progress Notes (Signed)
Patient being dc home via wheelchair in stable condition.

## 2023-07-29 DIAGNOSIS — Z79899 Other long term (current) drug therapy: Secondary | ICD-10-CM | POA: Diagnosis not present

## 2023-07-29 DIAGNOSIS — E790 Hyperuricemia without signs of inflammatory arthritis and tophaceous disease: Secondary | ICD-10-CM | POA: Diagnosis not present

## 2023-07-29 DIAGNOSIS — R0902 Hypoxemia: Secondary | ICD-10-CM | POA: Diagnosis not present

## 2023-07-29 DIAGNOSIS — I318 Other specified diseases of pericardium: Secondary | ICD-10-CM | POA: Diagnosis not present

## 2023-07-29 DIAGNOSIS — G4730 Sleep apnea, unspecified: Secondary | ICD-10-CM | POA: Diagnosis not present

## 2023-07-29 DIAGNOSIS — I10 Essential (primary) hypertension: Secondary | ICD-10-CM | POA: Diagnosis not present

## 2023-07-29 DIAGNOSIS — R634 Abnormal weight loss: Secondary | ICD-10-CM | POA: Diagnosis not present

## 2023-07-29 DIAGNOSIS — R7303 Prediabetes: Secondary | ICD-10-CM | POA: Diagnosis not present

## 2023-07-29 DIAGNOSIS — R768 Other specified abnormal immunological findings in serum: Secondary | ICD-10-CM | POA: Diagnosis not present

## 2023-07-29 DIAGNOSIS — M130 Polyarthritis, unspecified: Secondary | ICD-10-CM | POA: Diagnosis not present

## 2023-07-29 DIAGNOSIS — Z9181 History of falling: Secondary | ICD-10-CM | POA: Diagnosis not present

## 2023-07-29 DIAGNOSIS — E1169 Type 2 diabetes mellitus with other specified complication: Secondary | ICD-10-CM | POA: Diagnosis not present

## 2023-07-29 DIAGNOSIS — G8194 Hemiplegia, unspecified affecting left nondominant side: Secondary | ICD-10-CM | POA: Diagnosis not present

## 2023-07-29 DIAGNOSIS — M549 Dorsalgia, unspecified: Secondary | ICD-10-CM | POA: Diagnosis not present

## 2023-07-29 DIAGNOSIS — J441 Chronic obstructive pulmonary disease with (acute) exacerbation: Secondary | ICD-10-CM | POA: Diagnosis not present

## 2023-07-31 DIAGNOSIS — Z720 Tobacco use: Secondary | ICD-10-CM | POA: Diagnosis not present

## 2023-07-31 DIAGNOSIS — I130 Hypertensive heart and chronic kidney disease with heart failure and stage 1 through stage 4 chronic kidney disease, or unspecified chronic kidney disease: Secondary | ICD-10-CM | POA: Diagnosis not present

## 2023-07-31 DIAGNOSIS — M1990 Unspecified osteoarthritis, unspecified site: Secondary | ICD-10-CM | POA: Diagnosis not present

## 2023-07-31 DIAGNOSIS — Z559 Problems related to education and literacy, unspecified: Secondary | ICD-10-CM | POA: Diagnosis not present

## 2023-07-31 DIAGNOSIS — J441 Chronic obstructive pulmonary disease with (acute) exacerbation: Secondary | ICD-10-CM | POA: Diagnosis not present

## 2023-07-31 DIAGNOSIS — J9601 Acute respiratory failure with hypoxia: Secondary | ICD-10-CM | POA: Diagnosis not present

## 2023-07-31 DIAGNOSIS — J440 Chronic obstructive pulmonary disease with acute lower respiratory infection: Secondary | ICD-10-CM | POA: Diagnosis not present

## 2023-07-31 DIAGNOSIS — N1831 Chronic kidney disease, stage 3a: Secondary | ICD-10-CM | POA: Diagnosis not present

## 2023-07-31 DIAGNOSIS — G4730 Sleep apnea, unspecified: Secondary | ICD-10-CM | POA: Diagnosis not present

## 2023-07-31 DIAGNOSIS — I5033 Acute on chronic diastolic (congestive) heart failure: Secondary | ICD-10-CM | POA: Diagnosis not present

## 2023-07-31 DIAGNOSIS — I3139 Other pericardial effusion (noninflammatory): Secondary | ICD-10-CM | POA: Diagnosis not present

## 2023-07-31 DIAGNOSIS — N179 Acute kidney failure, unspecified: Secondary | ICD-10-CM | POA: Diagnosis not present

## 2023-08-05 ENCOUNTER — Ambulatory Visit: Payer: 59 | Attending: Internal Medicine | Admitting: Internal Medicine

## 2023-08-05 ENCOUNTER — Encounter: Payer: Self-pay | Admitting: Internal Medicine

## 2023-08-05 VITALS — BP 109/74 | HR 90 | Resp 16 | Ht 68.5 in | Wt 249.0 lb

## 2023-08-05 DIAGNOSIS — M130 Polyarthritis, unspecified: Secondary | ICD-10-CM | POA: Diagnosis not present

## 2023-08-05 DIAGNOSIS — J441 Chronic obstructive pulmonary disease with (acute) exacerbation: Secondary | ICD-10-CM | POA: Diagnosis not present

## 2023-08-05 DIAGNOSIS — I3139 Other pericardial effusion (noninflammatory): Secondary | ICD-10-CM | POA: Diagnosis not present

## 2023-08-05 DIAGNOSIS — I309 Acute pericarditis, unspecified: Secondary | ICD-10-CM | POA: Diagnosis not present

## 2023-08-05 DIAGNOSIS — I129 Hypertensive chronic kidney disease with stage 1 through stage 4 chronic kidney disease, or unspecified chronic kidney disease: Secondary | ICD-10-CM | POA: Diagnosis not present

## 2023-08-05 DIAGNOSIS — I318 Other specified diseases of pericardium: Secondary | ICD-10-CM | POA: Diagnosis not present

## 2023-08-05 DIAGNOSIS — G8194 Hemiplegia, unspecified affecting left nondominant side: Secondary | ICD-10-CM | POA: Diagnosis not present
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
## 2023-08-06 DIAGNOSIS — I5033 Acute on chronic diastolic (congestive) heart failure: Secondary | ICD-10-CM | POA: Diagnosis not present

## 2023-08-06 DIAGNOSIS — I3139 Other pericardial effusion (noninflammatory): Secondary | ICD-10-CM | POA: Diagnosis not present

## 2023-08-06 DIAGNOSIS — I130 Hypertensive heart and chronic kidney disease with heart failure and stage 1 through stage 4 chronic kidney disease, or unspecified chronic kidney disease: Secondary | ICD-10-CM | POA: Diagnosis not present

## 2023-08-06 DIAGNOSIS — J441 Chronic obstructive pulmonary disease with (acute) exacerbation: Secondary | ICD-10-CM | POA: Diagnosis not present

## 2023-08-06 DIAGNOSIS — J9601 Acute respiratory failure with hypoxia: Secondary | ICD-10-CM | POA: Diagnosis not present

## 2023-08-06 DIAGNOSIS — M1990 Unspecified osteoarthritis, unspecified site: Secondary | ICD-10-CM | POA: Diagnosis not present

## 2023-08-06 DIAGNOSIS — J440 Chronic obstructive pulmonary disease with acute lower respiratory infection: Secondary | ICD-10-CM | POA: Diagnosis not present

## 2023-08-06 DIAGNOSIS — N1831 Chronic kidney disease, stage 3a: Secondary | ICD-10-CM | POA: Diagnosis not present

## 2023-08-06 DIAGNOSIS — Z720 Tobacco use: Secondary | ICD-10-CM | POA: Diagnosis not present

## 2023-08-06 DIAGNOSIS — G4730 Sleep apnea, unspecified: Secondary | ICD-10-CM | POA: Diagnosis not present

## 2023-08-06 DIAGNOSIS — Z559 Problems related to education and literacy, unspecified: Secondary | ICD-10-CM | POA: Diagnosis not present

## 2023-08-06 DIAGNOSIS — N179 Acute kidney failure, unspecified: Secondary | ICD-10-CM | POA: Diagnosis not present

## 2023-08-07 DIAGNOSIS — I517 Cardiomegaly: Secondary | ICD-10-CM | POA: Diagnosis not present

## 2023-08-07 DIAGNOSIS — I10 Essential (primary) hypertension: Secondary | ICD-10-CM | POA: Diagnosis not present

## 2023-08-07 DIAGNOSIS — I639 Cerebral infarction, unspecified: Secondary | ICD-10-CM | POA: Diagnosis not present

## 2023-08-07 DIAGNOSIS — J449 Chronic obstructive pulmonary disease, unspecified: Secondary | ICD-10-CM | POA: Diagnosis not present

## 2023-08-07 DIAGNOSIS — Z131 Encounter for screening for diabetes mellitus: Secondary | ICD-10-CM | POA: Diagnosis not present

## 2023-08-07 DIAGNOSIS — I319 Disease of pericardium, unspecified: Secondary | ICD-10-CM | POA: Diagnosis not present

## 2023-08-07 DIAGNOSIS — R0902 Hypoxemia: Secondary | ICD-10-CM | POA: Diagnosis not present

## 2023-08-07 DIAGNOSIS — I3139 Other pericardial effusion (noninflammatory): Secondary | ICD-10-CM | POA: Diagnosis not present

## 2023-08-07 LAB — SEDIMENTATION RATE: Sed Rate: 48 mm/h — ABNORMAL HIGH (ref 0–20)

## 2023-08-07 LAB — C-REACTIVE PROTEIN: CRP: 24.2 mg/L — ABNORMAL HIGH (ref <8.0)

## 2023-08-07 LAB — CYCLIC CITRUL PEPTIDE ANTIBODY, IGG: Cyclic Citrullin Peptide Ab: <16 U

## 2023-08-07 LAB — ANTI-NUCLEAR AB-TITER (ANA TITER): ANA Titer 1: 1:40 {titer} — ABNORMAL HIGH

## 2023-08-07 LAB — RHEUMATOID FACTOR: Rheumatoid fact SerPl-aCnc: 45 [IU]/mL — ABNORMAL HIGH (ref <14)

## 2023-08-07 LAB — ANA: Anti Nuclear Antibody (ANA): POSITIVE — AB

## 2023-08-08 DIAGNOSIS — I130 Hypertensive heart and chronic kidney disease with heart failure and stage 1 through stage 4 chronic kidney disease, or unspecified chronic kidney disease: Secondary | ICD-10-CM | POA: Diagnosis not present

## 2023-08-08 DIAGNOSIS — I5033 Acute on chronic diastolic (congestive) heart failure: Secondary | ICD-10-CM | POA: Diagnosis not present

## 2023-08-08 DIAGNOSIS — M1990 Unspecified osteoarthritis, unspecified site: Secondary | ICD-10-CM | POA: Diagnosis not present

## 2023-08-08 DIAGNOSIS — Z720 Tobacco use: Secondary | ICD-10-CM | POA: Diagnosis not present

## 2023-08-08 DIAGNOSIS — J440 Chronic obstructive pulmonary disease with acute lower respiratory infection: Secondary | ICD-10-CM | POA: Diagnosis not present

## 2023-08-08 DIAGNOSIS — J9601 Acute respiratory failure with hypoxia: Secondary | ICD-10-CM | POA: Diagnosis not present

## 2023-08-08 DIAGNOSIS — G4730 Sleep apnea, unspecified: Secondary | ICD-10-CM | POA: Diagnosis not present

## 2023-08-08 DIAGNOSIS — N179 Acute kidney failure, unspecified: Secondary | ICD-10-CM | POA: Diagnosis not present

## 2023-08-08 DIAGNOSIS — N1831 Chronic kidney disease, stage 3a: Secondary | ICD-10-CM | POA: Diagnosis not present

## 2023-08-08 DIAGNOSIS — I3139 Other pericardial effusion (noninflammatory): Secondary | ICD-10-CM | POA: Diagnosis not present

## 2023-08-08 DIAGNOSIS — Z559 Problems related to education and literacy, unspecified: Secondary | ICD-10-CM | POA: Diagnosis not present

## 2023-08-08 DIAGNOSIS — J441 Chronic obstructive pulmonary disease with (acute) exacerbation: Secondary | ICD-10-CM | POA: Diagnosis not present

## 2023-08-08 NOTE — Plan of Care (Signed)
 [facility] Tonsillectomy/Adenoidectomy, Postoperative PEDS care plan entered in error.

## 2023-08-14 DIAGNOSIS — Z559 Problems related to education and literacy, unspecified: Secondary | ICD-10-CM | POA: Diagnosis not present

## 2023-08-14 DIAGNOSIS — N179 Acute kidney failure, unspecified: Secondary | ICD-10-CM | POA: Diagnosis not present

## 2023-08-14 DIAGNOSIS — G4730 Sleep apnea, unspecified: Secondary | ICD-10-CM | POA: Diagnosis not present

## 2023-08-14 DIAGNOSIS — J9601 Acute respiratory failure with hypoxia: Secondary | ICD-10-CM | POA: Diagnosis not present

## 2023-08-14 DIAGNOSIS — I130 Hypertensive heart and chronic kidney disease with heart failure and stage 1 through stage 4 chronic kidney disease, or unspecified chronic kidney disease: Secondary | ICD-10-CM | POA: Diagnosis not present

## 2023-08-14 DIAGNOSIS — Z720 Tobacco use: Secondary | ICD-10-CM | POA: Diagnosis not present

## 2023-08-14 DIAGNOSIS — N1831 Chronic kidney disease, stage 3a: Secondary | ICD-10-CM | POA: Diagnosis not present

## 2023-08-14 DIAGNOSIS — I5033 Acute on chronic diastolic (congestive) heart failure: Secondary | ICD-10-CM | POA: Diagnosis not present

## 2023-08-14 DIAGNOSIS — I3139 Other pericardial effusion (noninflammatory): Secondary | ICD-10-CM | POA: Diagnosis not present

## 2023-08-14 DIAGNOSIS — J440 Chronic obstructive pulmonary disease with acute lower respiratory infection: Secondary | ICD-10-CM | POA: Diagnosis not present

## 2023-08-14 DIAGNOSIS — M1990 Unspecified osteoarthritis, unspecified site: Secondary | ICD-10-CM | POA: Diagnosis not present

## 2023-08-14 DIAGNOSIS — J441 Chronic obstructive pulmonary disease with (acute) exacerbation: Secondary | ICD-10-CM | POA: Diagnosis not present

## 2023-08-15 DIAGNOSIS — J449 Chronic obstructive pulmonary disease, unspecified: Secondary | ICD-10-CM | POA: Diagnosis not present

## 2023-08-15 DIAGNOSIS — J9601 Acute respiratory failure with hypoxia: Secondary | ICD-10-CM | POA: Diagnosis not present

## 2023-08-19 DIAGNOSIS — N179 Acute kidney failure, unspecified: Secondary | ICD-10-CM | POA: Diagnosis not present

## 2023-08-19 DIAGNOSIS — Z559 Problems related to education and literacy, unspecified: Secondary | ICD-10-CM | POA: Diagnosis not present

## 2023-08-19 DIAGNOSIS — G4730 Sleep apnea, unspecified: Secondary | ICD-10-CM | POA: Diagnosis not present

## 2023-08-19 DIAGNOSIS — J441 Chronic obstructive pulmonary disease with (acute) exacerbation: Secondary | ICD-10-CM | POA: Diagnosis not present

## 2023-08-19 DIAGNOSIS — Z720 Tobacco use: Secondary | ICD-10-CM | POA: Diagnosis not present

## 2023-08-19 DIAGNOSIS — N1831 Chronic kidney disease, stage 3a: Secondary | ICD-10-CM | POA: Diagnosis not present

## 2023-08-19 DIAGNOSIS — I130 Hypertensive heart and chronic kidney disease with heart failure and stage 1 through stage 4 chronic kidney disease, or unspecified chronic kidney disease: Secondary | ICD-10-CM | POA: Diagnosis not present

## 2023-08-19 DIAGNOSIS — J440 Chronic obstructive pulmonary disease with acute lower respiratory infection: Secondary | ICD-10-CM | POA: Diagnosis not present

## 2023-08-19 DIAGNOSIS — I3139 Other pericardial effusion (noninflammatory): Secondary | ICD-10-CM | POA: Diagnosis not present

## 2023-08-19 DIAGNOSIS — M1990 Unspecified osteoarthritis, unspecified site: Secondary | ICD-10-CM | POA: Diagnosis not present

## 2023-08-19 DIAGNOSIS — J9601 Acute respiratory failure with hypoxia: Secondary | ICD-10-CM | POA: Diagnosis not present

## 2023-08-19 DIAGNOSIS — I5033 Acute on chronic diastolic (congestive) heart failure: Secondary | ICD-10-CM | POA: Diagnosis not present

## 2023-08-22 ENCOUNTER — Ambulatory Visit (HOSPITAL_COMMUNITY): Payer: 59 | Attending: Cardiovascular Disease

## 2023-08-22 ENCOUNTER — Telehealth: Payer: Self-pay

## 2023-08-22 DIAGNOSIS — I3139 Other pericardial effusion (noninflammatory): Secondary | ICD-10-CM

## 2023-08-22 LAB — ECHOCARDIOGRAM LIMITED
Area-P 1/2: 3.23 cm2
S' Lateral: 2.5 cm

## 2023-08-22 NOTE — Telephone Encounter (Signed)
Called patient advised of below they verbalized understanding. Have to call patient back on Monday.

## 2023-08-22 NOTE — Telephone Encounter (Signed)
-----   Message from Jonita Albee sent at 08/22/2023  3:26 PM EST ----- Please tell patient that his EF showed that his ejection fraction (pumping function of the heart ) was normal at 55-60%.  There was some thickening of the heart muscle, similar to echocardiogram from 07/2023. There is no longer any fluid around his heart, which is good news  Will discuss results further at his office appointment   Thanks KJ

## 2023-08-23 NOTE — Progress Notes (Unsigned)
  Cardiology Office Note:  .   Date:  08/23/2023  ID:  [name], DOB [birth year], MRN [id number] PCP: Renaye Rakers, MD  [facility] Providers Cardiologist:  Thomasene Ripple, DO  History of Present Illness: .   [name] is a 62 y.o. male with a PMH of recent pericardial effusion, COPD, OSA, CKD. Patient is followed by Dr. Servando Salina and presents today for evaluation of pericardial effusion   Patient was admitted to [facility] from 9/25-10/1 with pneumonia, COPD, and pericardial effusion. He was medically treated and discharged home. Presented to the ED on 10/6 with chest pain, dyspnea. CT chest showed a large pericardial effusion with diffuse pericardial thickening, possible acute pericarditis. Underwent echocardiogram on 07/20/23 that showed EF 60-65%, no wall motion abnormalities, moderate LVH, normal RV function, moderate-large pericardial effusion without evidence of cardiac tamponade. CRP was elevated. Patient was started on colchicine and IV lasix. He was discharged on colchicine, lasix, jardiance, steroid taper.   Patient was referred to rheumatology and was seen on 10/22. Undergoing workup for inflammatory/autoimmune etiology.   Repeat limited echocardiogram on 11/8 showed EF 55-60%, moderate LVH, grade I DD. Pericardial effusion no longer present.   Pericardial Effusion  - Previously had a large pericardial effusion on echo in 07/2023. CRP was elevated, suspected to have been inflammatory. Started on colchicine, steroids  - Repeat echocardiogram from 08/2023 was without pericardial effusion  - Follwed by rheumatology - appears to have had positive ANA, elevated rheumatoid factor, elevated CRP to 24.2, Sed rate elevated to 48 on 10/22  - Continue colchicine 0.6 mg BID, prednisone 40 mg daily  - Ordered BMP on colchicine   Chronic Diastolic Heart Failure - Limited echocardiogram from 11/8 showed EF 55-60%, moderate LVH, grade I DD - Continue lasix 40 mg daily, jardiance 10  mg daily  - Ordered BMP for monitoring   HTN  - Continue amlodipine 10 mg daily    ROS: ***  Studies Reviewed: .        *** Risk Assessment/Calculations:   {Does this patient have ATRIAL FIBRILLATION?:[id number]} No BP recorded.  {Refresh Note OR Click here to enter BP  :1}***       Physical Exam:   VS:  There were no vitals taken for this visit.   Wt Readings from Last 3 Encounters:  08/05/23 249 lb (112.9 kg)  07/26/23 250 lb (113.4 kg)  06/21/21 240 lb (108.9 kg)    GEN: Well nourished, well developed in no acute distress NECK: No JVD; No carotid bruits CARDIAC: ***RRR, no murmurs, rubs, gallops RESPIRATORY:  Clear to auscultation without rales, wheezing or rhonchi  ABDOMEN: Soft, non-tender, non-distended EXTREMITIES:  No edema; No deformity   ASSESSMENT AND PLAN: .   ***    {Are you ordering a CV Procedure (e.g. stress test, cath, DCCV, TEE, etc)?   Press F2        :[id number]}  Dispo: ***  Signed, Jonita Albee, PA-C

## 2023-08-25 DIAGNOSIS — J441 Chronic obstructive pulmonary disease with (acute) exacerbation: Secondary | ICD-10-CM | POA: Diagnosis not present

## 2023-08-25 DIAGNOSIS — I5033 Acute on chronic diastolic (congestive) heart failure: Secondary | ICD-10-CM | POA: Diagnosis not present

## 2023-08-25 DIAGNOSIS — N1831 Chronic kidney disease, stage 3a: Secondary | ICD-10-CM | POA: Diagnosis not present

## 2023-08-25 DIAGNOSIS — M1990 Unspecified osteoarthritis, unspecified site: Secondary | ICD-10-CM | POA: Diagnosis not present

## 2023-08-25 DIAGNOSIS — I130 Hypertensive heart and chronic kidney disease with heart failure and stage 1 through stage 4 chronic kidney disease, or unspecified chronic kidney disease: Secondary | ICD-10-CM | POA: Diagnosis not present

## 2023-08-25 DIAGNOSIS — N179 Acute kidney failure, unspecified: Secondary | ICD-10-CM | POA: Diagnosis not present

## 2023-08-25 DIAGNOSIS — J9601 Acute respiratory failure with hypoxia: Secondary | ICD-10-CM | POA: Diagnosis not present

## 2023-08-25 DIAGNOSIS — Z720 Tobacco use: Secondary | ICD-10-CM | POA: Diagnosis not present

## 2023-08-25 DIAGNOSIS — G4730 Sleep apnea, unspecified: Secondary | ICD-10-CM | POA: Diagnosis not present

## 2023-08-25 DIAGNOSIS — I3139 Other pericardial effusion (noninflammatory): Secondary | ICD-10-CM | POA: Diagnosis not present

## 2023-08-25 DIAGNOSIS — Z559 Problems related to education and literacy, unspecified: Secondary | ICD-10-CM | POA: Diagnosis not present

## 2023-08-25 DIAGNOSIS — J440 Chronic obstructive pulmonary disease with acute lower respiratory infection: Secondary | ICD-10-CM | POA: Diagnosis not present

## 2023-08-25 NOTE — Telephone Encounter (Signed)
Called pt back regarding address of office for visit tomorrow. Pt states that he does have the address and he will be here tomorrow for his appointment with Robet Leu, PA.

## 2023-08-26 ENCOUNTER — Ambulatory Visit: Payer: 59 | Attending: Cardiology | Admitting: Cardiology

## 2023-08-26 ENCOUNTER — Encounter: Payer: Self-pay | Admitting: Cardiology

## 2023-08-26 VITALS — BP 90/70 | HR 62 | Ht 66.5 in | Wt 246.0 lb

## 2023-08-26 DIAGNOSIS — G8194 Hemiplegia, unspecified affecting left nondominant side: Secondary | ICD-10-CM | POA: Diagnosis not present

## 2023-08-26 DIAGNOSIS — I3139 Other pericardial effusion (noninflammatory): Secondary | ICD-10-CM

## 2023-08-26 DIAGNOSIS — I5032 Chronic diastolic (congestive) heart failure: Secondary | ICD-10-CM | POA: Diagnosis not present

## 2023-08-26 DIAGNOSIS — M130 Polyarthritis, unspecified: Secondary | ICD-10-CM | POA: Diagnosis not present

## 2023-08-26 DIAGNOSIS — I10 Essential (primary) hypertension: Secondary | ICD-10-CM

## 2023-08-26 DIAGNOSIS — I517 Cardiomegaly: Secondary | ICD-10-CM

## 2023-08-26 DIAGNOSIS — I319 Disease of pericardium, unspecified: Secondary | ICD-10-CM

## 2023-08-26 DIAGNOSIS — G8921 Chronic pain due to trauma: Secondary | ICD-10-CM | POA: Diagnosis not present

## 2023-08-26 DIAGNOSIS — G4730 Sleep apnea, unspecified: Secondary | ICD-10-CM | POA: Diagnosis not present

## 2023-08-26 DIAGNOSIS — I309 Acute pericarditis, unspecified: Secondary | ICD-10-CM | POA: Diagnosis not present

## 2023-08-26 MED ORDER — AMLODIPINE BESYLATE 5 MG PO TABS: 5.0000 mg | ORAL_TABLET | Freq: Every day | ORAL | 3 refills | Status: AC

## 2023-08-26 NOTE — Patient Instructions (Signed)
Medication Instructions:  Decrease Amlodipine 5 mg once a day *If you need a refill on your cardiac medications before your next appointment, please call your pharmacy*  Lab Work: Today we will be drawing CRP, ESR, and BMP If you have labs (blood work) drawn today and your tests are completely normal, you will receive your results only by: MyChart Message (if you have MyChart) OR A paper copy in the mail If you have any lab test that is abnormal or we need to change your treatment, we will call you to review the results.  Testing/Procedures:   Your cardiac CT will be scheduled at one of the below locations:   [facility] [address] [place], [place] [address] [phone]  If scheduled at [facility], please arrive at the [facility] and Children's Entrance (Entrance C2) of [facility] 30 minutes prior to test start time. You can use the FREE valet parking offered at entrance C (encouraged to control the heart rate for the test)  Proceed to the [facility] Radiology Department (first floor) to check-in and test prep.  All radiology patients and guests should use entrance C2 at [facility], accessed from [facility], even though the hospital's physical address listed is [address].     Please follow these instructions carefully (unless otherwise directed):  An IV will be required for this test and Nitroglycerin will be given.  Hold all erectile dysfunction medications at least 3 days (72 hrs) prior to test. (Ie viagra, cialis, sildenafil, tadalafil, etc)   On the Night Before the Test: Be sure to Drink plenty of water. Do not consume any caffeinated/decaffeinated beverages or chocolate 12 hours prior to your test. Do not take any antihistamines 12 hours prior to your test.  On the Day of the Test: Drink plenty of water until 1 hour prior to the test. Do not eat any food 1 hour prior to test. You may take your  regular medications prior to the test.  Take metoprolol (Lopressor) two hours prior to test. If you take Furosemide/Hydrochlorothiazide/Spironolactone, please HOLD on the morning of the test.      After the Test: Drink plenty of water. After receiving IV contrast, you may experience a mild flushed feeling. This is normal. On occasion, you may experience a mild rash up to 24 hours after the test. This is not dangerous. If this occurs, you can take Benadryl 25 mg and increase your fluid intake. If you experience trouble breathing, this can be serious. If it is severe call 911 IMMEDIATELY. If it is mild, please call our office. If you take any of these medications: Glipizide/Metformin, Avandament, Glucavance, please do not take 48 hours after completing test unless otherwise instructed.  We will call to schedule your test 2-4 weeks out understanding that some insurance companies will need an authorization prior to the service being performed.   For more information and frequently asked questions, please visit our website : [email or web address]  For non-scheduling related questions, please contact the cardiac imaging nurse navigator should you have any questions/concerns: Cardiac Imaging Nurse Navigators Direct Office Dial: [phone]   For scheduling needs, including cancellations and rescheduling, please call [place], [phone].   Follow-Up: At [facility], you and your health needs are our priority.  As part of our continuing mission to provide you with exceptional heart care, we have created designated Provider Care Teams.  These Care Teams include your primary Cardiologist (physician) and Advanced Practice  Providers (APPs -  Physician Assistants and Nurse Practitioners) who all work together to provide you with the care you need, when you need it.  We recommend signing up for the patient portal called "MyChart".  Sign up information is provided on this After  Visit Summary.  MyChart is used to connect with patients for Virtual Visits (Telemedicine).  Patients are able to view lab/test results, encounter notes, upcoming appointments, etc.  Non-urgent messages can be sent to your provider as well.   To learn more about what you can do with MyChart, go to [email or web address].    Your next appointment:   2 week(s)  Provider:   Robet Leu, PA-C or any APP

## 2023-08-27 LAB — BASIC METABOLIC PANEL
BUN/Creatinine Ratio: 32 — ABNORMAL HIGH (ref 10–24)
BUN: 25 mg/dL (ref 8–27)
CO2: 30 mmol/L — ABNORMAL HIGH (ref 20–29)
Calcium: 9.3 mg/dL (ref 8.6–10.2)
Chloride: 92 mmol/L — ABNORMAL LOW (ref 96–106)
Creatinine, Ser: 0.78 mg/dL (ref 0.76–1.27)
Glucose: 189 mg/dL — ABNORMAL HIGH (ref 70–99)
Potassium: 5.1 mmol/L (ref 3.5–5.2)
Sodium: 137 mmol/L (ref 134–144)
eGFR: 102 mL/min/{1.73_m2} (ref >59)

## 2023-08-27 LAB — C-REACTIVE PROTEIN: CRP: 50 mg/L — ABNORMAL HIGH (ref 0–10)

## 2023-08-27 LAB — SEDIMENTATION RATE: Sed Rate: 91 mm/h — ABNORMAL HIGH (ref 0–30)

## 2023-08-28 ENCOUNTER — Telehealth: Payer: Self-pay

## 2023-08-28 ENCOUNTER — Other Ambulatory Visit: Payer: Self-pay

## 2023-08-28 DIAGNOSIS — Z9181 History of falling: Secondary | ICD-10-CM | POA: Diagnosis not present

## 2023-08-28 DIAGNOSIS — R030 Elevated blood-pressure reading, without diagnosis of hypertension: Secondary | ICD-10-CM | POA: Diagnosis not present

## 2023-08-28 DIAGNOSIS — R768 Other specified abnormal immunological findings in serum: Secondary | ICD-10-CM | POA: Diagnosis not present

## 2023-08-28 DIAGNOSIS — R7303 Prediabetes: Secondary | ICD-10-CM | POA: Diagnosis not present

## 2023-08-28 DIAGNOSIS — Z79899 Other long term (current) drug therapy: Secondary | ICD-10-CM | POA: Diagnosis not present

## 2023-08-28 DIAGNOSIS — I10 Essential (primary) hypertension: Secondary | ICD-10-CM | POA: Diagnosis not present

## 2023-08-28 DIAGNOSIS — R079 Chest pain, unspecified: Secondary | ICD-10-CM

## 2023-08-28 DIAGNOSIS — M549 Dorsalgia, unspecified: Secondary | ICD-10-CM | POA: Diagnosis not present

## 2023-08-28 MED ORDER — COLCHICINE 0.6 MG PO TABS: 0.6000 mg | ORAL_TABLET | Freq: Two times a day (BID) | ORAL | 0 refills | Status: AC

## 2023-08-28 NOTE — Progress Notes (Signed)
Your cardiac CT will be scheduled at one of the below locations:   [facility] [address] [place], [place] [address] [phone]  OR  [facility] [address] [place], [place] [address] [phone]  OR   [facility] [address] [place], [place] [address] [phone]  If scheduled at [facility], please arrive at the [facility] and Children's Entrance (Entrance C2) of [facility] 30 minutes prior to test start time. You can use the FREE valet parking offered at entrance C (encouraged to control the heart rate for the test)  Proceed to the [facility] Radiology Department (first floor) to check-in and test prep.  All radiology patients and guests should use entrance C2 at [facility], accessed from [facility], even though the hospital's physical address listed is [address]    If scheduled at [facility] or [facility], please arrive 15 mins early for check-in and test prep.  There is spacious parking and easy access to the radiology department from the [facility] Heart and Vascular entrance. Please enter here and check-in with the desk attendant.   Please follow these instructions carefully (unless otherwise directed):  An IV will be required for this test and Nitroglycerin will be given.  Hold all erectile dysfunction medications at least 3 days (72 hrs) prior to test. (Ie viagra, cialis, sildenafil, tadalafil, etc)   On the Night Before the Test: Be sure to Drink plenty of water. Do not consume any caffeinated/decaffeinated beverages or chocolate 12 hours prior to your test. Do not take any antihistamines 12 hours prior to your test. If the patient has contrast allergy: Patient will need a prescription for Prednisone and very clear instructions (as follows): Prednisone 50 mg - take 13 hours  prior to test Take another Prednisone 50 mg 7 hours prior to test Take another Prednisone 50 mg 1 hour prior to test Take Benadryl 50 mg 1 hour prior to test Patient must complete all four doses of above prophylactic medications. Patient will need a ride after test due to Benadryl.  On the Day of the Test: Drink plenty of water until 1 hour prior to the test. Do not eat any food 1 hour prior to test. You may take your regular medications prior to the test.  Take metoprolol (Lopressor) two hours prior to test. If you take Furosemide/Hydrochlorothiazide/Spironolactone, please HOLD on the morning of the test. FEMALES- please wear underwire-free bra if available, avoid dresses & tight clothing  *For Clinical Staff only. Please instruct patient the following:* Heart Rate Medication Recommendations for Cardiac CT  Resting HR < 50 bpm  No medication  Resting HR 50-60 bpm and BP >110/50 mmHG   Consider Metoprolol tartrate 25 mg PO 90-120 min prior to scan  Resting HR 60-65 bpm and BP >110/50 mmHG  Metoprolol tartrate 50 mg PO 90-120 minutes prior to scan   Resting HR > 65 bpm and BP >110/50 mmHG  Metoprolol tartrate 100 mg PO 90-120 minutes prior to scan  Consider Ivabradine 10-15 mg PO or a calcium channel blocker for resting HR >60 bpm and contraindication to metoprolol tartrate  Consider Ivabradine 10-15 mg PO in combination with metoprolol tartrate for HR >80 bpm        After the Test: Drink plenty of water. After receiving IV contrast, you may experience a mild flushed feeling. This is normal. On occasion, you  may experience a mild rash up to 24 hours after the test. This is not dangerous. If this occurs, you can take Benadryl 25 mg and increase your fluid intake. If you experience trouble breathing, this can be serious. If it is severe call 911 IMMEDIATELY. If it is mild, please call our office. If you take any of these medications: Glipizide/Metformin, Avandament, Glucavance, please  do not take 48 hours after completing test unless otherwise instructed.  We will call to schedule your test 2-4 weeks out understanding that some insurance companies will need an authorization prior to the service being performed.   For more information and frequently asked questions, please visit our website : [email or web address]  For non-scheduling related questions, please contact the cardiac imaging nurse navigator should you have any questions/concerns: Cardiac Imaging Nurse Navigators Direct Office Dial: [phone]   For scheduling needs, including cancellations and rescheduling, please call [place], [phone].

## 2023-08-28 NOTE — Telephone Encounter (Signed)
Left message for patient to return call.

## 2023-08-29 DIAGNOSIS — I3139 Other pericardial effusion (noninflammatory): Secondary | ICD-10-CM | POA: Diagnosis not present

## 2023-08-29 NOTE — Telephone Encounter (Signed)
Attempted to call patient to go over instructions for Coronary CT. Left message to return call.

## 2023-08-29 NOTE — Telephone Encounter (Signed)
Patient returned RN's call. 

## 2023-08-29 NOTE — Telephone Encounter (Signed)
Called and spoke to patient to go over instructions for Coronary CT. He did not want to listen to instructions. Patient stated "I just had test while in the hospital and another test yesterday" I explained the purpose of the test and the details on how the test is done. He stated " why the keep doing test on my heart" I advised him it was to make sure his heart is functioning properly. He did not want to listen to the instructions. I tried getting him to count his heart rate so a prescription could be sent in for Metoprolol. I instructed patient how to count heart rate for 1 minute and he stated "18-20" then stated "12 what should it be can we move pass this" Patient was very non compliant with listening to instructions. I did go talk to [facility] Dr Tobb's nurse to try and reach out to patient. I will also put a copy of instructions in the mail for patient.

## 2023-09-07 NOTE — Progress Notes (Deleted)
Cardiology Office Note:  .   Date:  09/07/2023  ID:  Roberto Scales, DOB July 22, 1961, MRN [id number] PCP: Renaye Rakers, MD  [facility] Providers Cardiologist:  Thomasene Ripple, DO  History of Present Illness: .   [name] is a 62 y.o. male with a PMH of recent pericardial effusion, COPD, OSA, CKD. Patient is followed by Dr. Servando Salina and presents today for evaluation of pericardial effusion    Patient was admitted to [facility] from 9/25-10/1 with pneumonia, COPD, and pericardial effusion. He was medically treated and discharged home. Presented to the ED on 10/6 with chest pain, dyspnea. CT chest showed a large pericardial effusion with diffuse pericardial thickening, possible acute pericarditis. Underwent echocardiogram on 07/20/23 that showed EF 60-65%, no wall motion abnormalities, moderate LVH, normal RV function, moderate-large pericardial effusion without evidence of cardiac tamponade. CRP was elevated. Patient was started on colchicine and IV lasix. He was discharged on colchicine, lasix, jardiance, steroid taper.    Patient was referred to rheumatology and was seen on 10/22. Undergoing workup for inflammatory/autoimmune etiology. ANA positive    Repeat limited echocardiogram on 11/8 showed EF 55-60%, moderate LVH, grade I DD. Pericardial effusion no longer present.   Patient was seen in clinic on 08/26/23. At that time, patient continued to complain of chest pain. He was unsure if he was taking any medications for pericarditis. CPR was elevated to 50, ESR elevated to 91. Patient was started back on colchicine and ibuprofen. Instructed to follow up with his rheumatologist  Pericardial Effusion  Pericarditis  - Previously had a large pericardial effusion on echo in 07/2023. CRP was elevated, suspected to have been inflammatory/pericarditis. Started on colchicine, steroids  - Repeat echocardiogram from 08/2023 showed that prior pericardial effusion no longer present  - Follwed  by rheumatology - appears to have had positive ANA, elevated CRP to 24.2, Sed rate elevated to 48 on 10/22  - When seen in clinic on 11/12, patient was no longer taking colchicine or prednisone. ESR elevated to 91, CRP elevated to 50. Restarted on colchicine 0.6 mg BID and ibuprofen 600 mg TID  - Instructed patient to follow up with rheumatology  - Ordered BMP    Chronic Diastolic Heart Failure LVH - Limited echocardiogram from 11/8 showed EF 55-60%, moderate LVH, grade I DD - He is euvolemic on exam today  - Continue lasix 40 mg daily, jardiance 10 mg daily  - Ordered BMP for monitoring  - Echocardiogram from 08/22/23 noted a small hyperdynamic LV cavity, likely small intracavitary mid gradient. Per discussion with Dr. Servando Salina, possible that patient may need stress echo in the future to better define gradients. Will defer study until pericarditis resolved    HTN  -   ROS: ***  Studies Reviewed: .        *** Risk Assessment/Calculations:   {Does this patient have ATRIAL FIBRILLATION?[phone]} No BP recorded.  {Refresh Note OR Click here to enter BP  :1}***       Physical Exam:   VS:  There were no vitals taken for this visit.   Wt Readings from Last 3 Encounters:  08/26/23 246 lb (111.6 kg)  08/05/23 249 lb (112.9 kg)  07/26/23 250 lb (113.4 kg)    GEN: Well nourished, well developed in no acute distress NECK: No JVD; No carotid bruits CARDIAC: ***RRR, no murmurs, rubs, gallops RESPIRATORY:  Clear to auscultation without rales, wheezing or rhonchi  ABDOMEN: Soft, non-tender, non-distended EXTREMITIES:  No edema; No deformity  ASSESSMENT AND PLAN: .   ***    {Are you ordering a CV Procedure (e.g. stress test, cath, DCCV, TEE, etc)?   Press F2        :[id number]}  Dispo: ***  Signed, Jonita Albee, PA-C

## 2023-09-09 ENCOUNTER — Ambulatory Visit: Payer: 59 | Attending: Cardiology | Admitting: Cardiology

## 2023-09-09 DIAGNOSIS — N1831 Chronic kidney disease, stage 3a: Secondary | ICD-10-CM | POA: Diagnosis not present

## 2023-09-09 DIAGNOSIS — Z559 Problems related to education and literacy, unspecified: Secondary | ICD-10-CM | POA: Diagnosis not present

## 2023-09-09 DIAGNOSIS — I3139 Other pericardial effusion (noninflammatory): Secondary | ICD-10-CM | POA: Diagnosis not present

## 2023-09-09 DIAGNOSIS — J441 Chronic obstructive pulmonary disease with (acute) exacerbation: Secondary | ICD-10-CM | POA: Diagnosis not present

## 2023-09-09 DIAGNOSIS — J440 Chronic obstructive pulmonary disease with acute lower respiratory infection: Secondary | ICD-10-CM | POA: Diagnosis not present

## 2023-09-09 DIAGNOSIS — G4730 Sleep apnea, unspecified: Secondary | ICD-10-CM | POA: Diagnosis not present

## 2023-09-09 DIAGNOSIS — M1990 Unspecified osteoarthritis, unspecified site: Secondary | ICD-10-CM | POA: Diagnosis not present

## 2023-09-09 DIAGNOSIS — Z720 Tobacco use: Secondary | ICD-10-CM | POA: Diagnosis not present

## 2023-09-09 DIAGNOSIS — N179 Acute kidney failure, unspecified: Secondary | ICD-10-CM | POA: Diagnosis not present

## 2023-09-09 DIAGNOSIS — J9601 Acute respiratory failure with hypoxia: Secondary | ICD-10-CM | POA: Diagnosis not present

## 2023-09-09 DIAGNOSIS — I5033 Acute on chronic diastolic (congestive) heart failure: Secondary | ICD-10-CM | POA: Diagnosis not present

## 2023-09-09 DIAGNOSIS — I130 Hypertensive heart and chronic kidney disease with heart failure and stage 1 through stage 4 chronic kidney disease, or unspecified chronic kidney disease: Secondary | ICD-10-CM | POA: Diagnosis not present

## 2023-09-14 DIAGNOSIS — J449 Chronic obstructive pulmonary disease, unspecified: Secondary | ICD-10-CM | POA: Diagnosis not present

## 2023-09-14 DIAGNOSIS — J9601 Acute respiratory failure with hypoxia: Secondary | ICD-10-CM | POA: Diagnosis not present

## 2023-09-15 ENCOUNTER — Telehealth (HOSPITAL_COMMUNITY): Payer: Self-pay | Admitting: Emergency Medicine

## 2023-09-15 NOTE — Telephone Encounter (Signed)
Reaching out to patient to offer assistance regarding upcoming cardiac imaging study; pt verbalizes understanding of appt date/time, parking situation and where to check in, pre-test NPO status and medications ordered, and verified current allergies; name and call back number provided for further questions should they arise Cayne Yom RN Navigator Cardiac Imaging [facility] [phone] office [phone] cell 

## 2023-09-16 ENCOUNTER — Ambulatory Visit (HOSPITAL_COMMUNITY)
Admission: RE | Admit: 2023-09-16 | Discharge: 2023-09-16 | Disposition: A | Discharge status: Home / Self Care | Payer: 59 | Source: Ambulatory Visit | Attending: Cardiology | Admitting: Cardiology

## 2023-09-16 DIAGNOSIS — R079 Chest pain, unspecified: Secondary | ICD-10-CM | POA: Diagnosis not present

## 2023-09-16 DIAGNOSIS — R072 Precordial pain: Secondary | ICD-10-CM

## 2023-09-16 DIAGNOSIS — I2510 Atherosclerotic heart disease of native coronary artery without angina pectoris: Secondary | ICD-10-CM | POA: Diagnosis not present

## 2023-09-16 DIAGNOSIS — G4730 Sleep apnea, unspecified: Secondary | ICD-10-CM | POA: Diagnosis not present

## 2023-09-16 DIAGNOSIS — I517 Cardiomegaly: Secondary | ICD-10-CM | POA: Diagnosis not present

## 2023-09-16 DIAGNOSIS — I10 Essential (primary) hypertension: Secondary | ICD-10-CM | POA: Diagnosis not present

## 2023-09-16 DIAGNOSIS — G8194 Hemiplegia, unspecified affecting left nondominant side: Secondary | ICD-10-CM | POA: Diagnosis not present

## 2023-09-16 DIAGNOSIS — J441 Chronic obstructive pulmonary disease with (acute) exacerbation: Secondary | ICD-10-CM | POA: Diagnosis not present

## 2023-09-16 DIAGNOSIS — E1169 Type 2 diabetes mellitus with other specified complication: Secondary | ICD-10-CM | POA: Diagnosis not present

## 2023-09-16 DIAGNOSIS — E782 Mixed hyperlipidemia: Secondary | ICD-10-CM | POA: Diagnosis not present

## 2023-09-16 MED ORDER — DILTIAZEM HCL 25 MG/5ML IV SOLN
5.0000 mg | Freq: Once | INTRAVENOUS | Status: AC
  Administered 2023-09-16: 5 mg via INTRAVENOUS

## 2023-09-16 MED ORDER — NITROGLYCERIN 0.4 MG SL SUBL
0.8000 mg | SUBLINGUAL_TABLET | Freq: Once | SUBLINGUAL | Status: AC
  Administered 2023-09-16: 0.8 mg via SUBLINGUAL

## 2023-09-16 MED ORDER — DILTIAZEM HCL 25 MG/5ML IV SOLN
INTRAVENOUS | Status: AC
  Filled 2023-09-16: qty 5

## 2023-09-16 MED ORDER — IOHEXOL 350 MG/ML SOLN
95.0000 mL | Freq: Once | INTRAVENOUS | Status: AC | PRN
  Administered 2023-09-16: 95 mL via INTRAVENOUS

## 2023-09-16 MED ORDER — NITROGLYCERIN 0.4 MG SL SUBL
SUBLINGUAL_TABLET | SUBLINGUAL | Status: AC
  Filled 2023-09-16: qty 2

## 2023-09-17 DIAGNOSIS — N179 Acute kidney failure, unspecified: Secondary | ICD-10-CM | POA: Diagnosis not present

## 2023-09-17 DIAGNOSIS — I5033 Acute on chronic diastolic (congestive) heart failure: Secondary | ICD-10-CM | POA: Diagnosis not present

## 2023-09-17 DIAGNOSIS — N1831 Chronic kidney disease, stage 3a: Secondary | ICD-10-CM | POA: Diagnosis not present

## 2023-09-17 DIAGNOSIS — M1990 Unspecified osteoarthritis, unspecified site: Secondary | ICD-10-CM | POA: Diagnosis not present

## 2023-09-17 DIAGNOSIS — G4730 Sleep apnea, unspecified: Secondary | ICD-10-CM | POA: Diagnosis not present

## 2023-09-17 DIAGNOSIS — I3139 Other pericardial effusion (noninflammatory): Secondary | ICD-10-CM | POA: Diagnosis not present

## 2023-09-17 DIAGNOSIS — I130 Hypertensive heart and chronic kidney disease with heart failure and stage 1 through stage 4 chronic kidney disease, or unspecified chronic kidney disease: Secondary | ICD-10-CM | POA: Diagnosis not present

## 2023-09-17 DIAGNOSIS — J441 Chronic obstructive pulmonary disease with (acute) exacerbation: Secondary | ICD-10-CM | POA: Diagnosis not present

## 2023-09-17 DIAGNOSIS — J440 Chronic obstructive pulmonary disease with acute lower respiratory infection: Secondary | ICD-10-CM | POA: Diagnosis not present

## 2023-09-17 DIAGNOSIS — J9601 Acute respiratory failure with hypoxia: Secondary | ICD-10-CM | POA: Diagnosis not present

## 2023-09-17 DIAGNOSIS — Z559 Problems related to education and literacy, unspecified: Secondary | ICD-10-CM | POA: Diagnosis not present

## 2023-09-17 DIAGNOSIS — Z720 Tobacco use: Secondary | ICD-10-CM | POA: Diagnosis not present

## 2023-09-22 ENCOUNTER — Telehealth: Payer: Self-pay | Admitting: Cardiology

## 2023-09-22 NOTE — Telephone Encounter (Signed)
Patient states he was returning call. Please advise ?

## 2023-09-22 NOTE — Telephone Encounter (Signed)
Spoke with pt, he reported they did not state why they were calling. Aware do not see a reason for the call. Will forward to dr tobb's nurse to review.

## 2023-09-23 NOTE — Telephone Encounter (Signed)
Patient identification verified by 2 forms. Jeb Levering, RN     Called and spoke to patient  Patient states he received a call and was unable to get in touch with anybody. Reviewed pt's chart. I do not see a documented recent call. Pt made aware. Pt states "so you're telling me I had a test 2 weeks ago and you can't tell me my results? I could be dead by now." Reassured pt I could not release results until they are released by the provider. Pt Is not happy with this response but he verbalized understanding. No further questions at this time

## 2023-09-26 DIAGNOSIS — Z9181 History of falling: Secondary | ICD-10-CM | POA: Diagnosis not present

## 2023-09-26 DIAGNOSIS — M549 Dorsalgia, unspecified: Secondary | ICD-10-CM | POA: Diagnosis not present

## 2023-09-26 DIAGNOSIS — Z79899 Other long term (current) drug therapy: Secondary | ICD-10-CM | POA: Diagnosis not present

## 2023-09-26 DIAGNOSIS — R768 Other specified abnormal immunological findings in serum: Secondary | ICD-10-CM | POA: Diagnosis not present

## 2023-09-26 DIAGNOSIS — R7303 Prediabetes: Secondary | ICD-10-CM | POA: Diagnosis not present

## 2023-09-26 DIAGNOSIS — I10 Essential (primary) hypertension: Secondary | ICD-10-CM | POA: Diagnosis not present

## 2023-10-15 DIAGNOSIS — J449 Chronic obstructive pulmonary disease, unspecified: Secondary | ICD-10-CM | POA: Diagnosis not present

## 2023-10-15 DIAGNOSIS — J9601 Acute respiratory failure with hypoxia: Secondary | ICD-10-CM | POA: Diagnosis not present

## 2023-10-24 ENCOUNTER — Ambulatory Visit: Payer: 59 | Admitting: Cardiology

## 2023-10-27 DIAGNOSIS — R030 Elevated blood-pressure reading, without diagnosis of hypertension: Secondary | ICD-10-CM | POA: Diagnosis not present

## 2023-10-27 DIAGNOSIS — Z79899 Other long term (current) drug therapy: Secondary | ICD-10-CM | POA: Diagnosis not present

## 2023-10-27 DIAGNOSIS — Z9181 History of falling: Secondary | ICD-10-CM | POA: Diagnosis not present

## 2023-10-27 DIAGNOSIS — M549 Dorsalgia, unspecified: Secondary | ICD-10-CM | POA: Diagnosis not present

## 2023-10-27 DIAGNOSIS — R768 Other specified abnormal immunological findings in serum: Secondary | ICD-10-CM | POA: Diagnosis not present

## 2023-10-27 DIAGNOSIS — I10 Essential (primary) hypertension: Secondary | ICD-10-CM | POA: Diagnosis not present

## 2023-10-30 DIAGNOSIS — Z79899 Other long term (current) drug therapy: Secondary | ICD-10-CM | POA: Diagnosis not present

## 2023-11-07 ENCOUNTER — Ambulatory Visit: Payer: 59 | Admitting: Cardiology

## 2023-11-15 DIAGNOSIS — J9601 Acute respiratory failure with hypoxia: Secondary | ICD-10-CM | POA: Diagnosis not present

## 2023-11-15 DIAGNOSIS — J449 Chronic obstructive pulmonary disease, unspecified: Secondary | ICD-10-CM | POA: Diagnosis not present

## 2023-11-24 DIAGNOSIS — I129 Hypertensive chronic kidney disease with stage 1 through stage 4 chronic kidney disease, or unspecified chronic kidney disease: Secondary | ICD-10-CM | POA: Diagnosis not present

## 2023-11-24 DIAGNOSIS — E1122 Type 2 diabetes mellitus with diabetic chronic kidney disease: Secondary | ICD-10-CM | POA: Diagnosis not present

## 2023-11-24 DIAGNOSIS — I10 Essential (primary) hypertension: Secondary | ICD-10-CM | POA: Diagnosis not present

## 2023-11-24 DIAGNOSIS — E782 Mixed hyperlipidemia: Secondary | ICD-10-CM | POA: Diagnosis not present

## 2023-11-24 DIAGNOSIS — E1165 Type 2 diabetes mellitus with hyperglycemia: Secondary | ICD-10-CM | POA: Diagnosis not present

## 2023-11-24 DIAGNOSIS — Z0000 Encounter for general adult medical examination without abnormal findings: Secondary | ICD-10-CM | POA: Diagnosis not present

## 2023-11-24 DIAGNOSIS — N1832 Chronic kidney disease, stage 3b: Secondary | ICD-10-CM | POA: Diagnosis not present

## 2023-11-24 DIAGNOSIS — N183 Chronic kidney disease, stage 3 unspecified: Secondary | ICD-10-CM | POA: Diagnosis not present

## 2023-11-26 DIAGNOSIS — R7303 Prediabetes: Secondary | ICD-10-CM | POA: Diagnosis not present

## 2023-11-26 DIAGNOSIS — R768 Other specified abnormal immunological findings in serum: Secondary | ICD-10-CM | POA: Diagnosis not present

## 2023-11-26 DIAGNOSIS — M47816 Spondylosis without myelopathy or radiculopathy, lumbar region: Secondary | ICD-10-CM | POA: Diagnosis not present

## 2023-11-26 DIAGNOSIS — Z0000 Encounter for general adult medical examination without abnormal findings: Secondary | ICD-10-CM | POA: Diagnosis not present

## 2023-11-26 DIAGNOSIS — I10 Essential (primary) hypertension: Secondary | ICD-10-CM | POA: Diagnosis not present

## 2023-11-26 DIAGNOSIS — Z9181 History of falling: Secondary | ICD-10-CM | POA: Diagnosis not present

## 2023-11-26 DIAGNOSIS — Z79899 Other long term (current) drug therapy: Secondary | ICD-10-CM | POA: Diagnosis not present

## 2023-12-02 DIAGNOSIS — Z79899 Other long term (current) drug therapy: Secondary | ICD-10-CM | POA: Diagnosis not present

## 2023-12-13 DIAGNOSIS — J9601 Acute respiratory failure with hypoxia: Secondary | ICD-10-CM | POA: Diagnosis not present

## 2023-12-13 DIAGNOSIS — J449 Chronic obstructive pulmonary disease, unspecified: Secondary | ICD-10-CM | POA: Diagnosis not present

## 2023-12-23 DIAGNOSIS — Z79899 Other long term (current) drug therapy: Secondary | ICD-10-CM | POA: Diagnosis not present

## 2023-12-23 DIAGNOSIS — J302 Other seasonal allergic rhinitis: Secondary | ICD-10-CM | POA: Diagnosis not present

## 2023-12-23 DIAGNOSIS — M47816 Spondylosis without myelopathy or radiculopathy, lumbar region: Secondary | ICD-10-CM | POA: Diagnosis not present

## 2023-12-23 DIAGNOSIS — R7303 Prediabetes: Secondary | ICD-10-CM | POA: Diagnosis not present

## 2023-12-23 DIAGNOSIS — I10 Essential (primary) hypertension: Secondary | ICD-10-CM | POA: Diagnosis not present

## 2023-12-23 DIAGNOSIS — G4452 New daily persistent headache (NDPH): Secondary | ICD-10-CM | POA: Diagnosis not present

## 2023-12-23 DIAGNOSIS — Z9181 History of falling: Secondary | ICD-10-CM | POA: Diagnosis not present

## 2023-12-23 DIAGNOSIS — R768 Other specified abnormal immunological findings in serum: Secondary | ICD-10-CM | POA: Diagnosis not present

## 2023-12-26 DIAGNOSIS — Z79899 Other long term (current) drug therapy: Secondary | ICD-10-CM | POA: Diagnosis not present

## 2024-01-13 DIAGNOSIS — J449 Chronic obstructive pulmonary disease, unspecified: Secondary | ICD-10-CM | POA: Diagnosis not present

## 2024-01-13 DIAGNOSIS — J9601 Acute respiratory failure with hypoxia: Secondary | ICD-10-CM | POA: Diagnosis not present

## 2024-01-18 DIAGNOSIS — R519 Headache, unspecified: Secondary | ICD-10-CM | POA: Diagnosis not present

## 2024-01-18 DIAGNOSIS — S098XXA Other specified injuries of head, initial encounter: Secondary | ICD-10-CM | POA: Diagnosis not present

## 2024-01-18 DIAGNOSIS — I509 Heart failure, unspecified: Secondary | ICD-10-CM | POA: Diagnosis not present

## 2024-01-18 DIAGNOSIS — D649 Anemia, unspecified: Secondary | ICD-10-CM | POA: Diagnosis not present

## 2024-01-18 DIAGNOSIS — I110 Hypertensive heart disease with heart failure: Secondary | ICD-10-CM | POA: Diagnosis not present

## 2024-01-18 DIAGNOSIS — S0990XA Unspecified injury of head, initial encounter: Secondary | ICD-10-CM | POA: Diagnosis not present

## 2024-01-18 DIAGNOSIS — Z87891 Personal history of nicotine dependence: Secondary | ICD-10-CM | POA: Diagnosis not present

## 2024-01-18 DIAGNOSIS — R600 Localized edema: Secondary | ICD-10-CM | POA: Diagnosis not present

## 2024-01-18 DIAGNOSIS — I10 Essential (primary) hypertension: Secondary | ICD-10-CM | POA: Diagnosis not present

## 2024-01-18 DIAGNOSIS — Z8673 Personal history of transient ischemic attack (TIA), and cerebral infarction without residual deficits: Secondary | ICD-10-CM | POA: Diagnosis not present

## 2024-01-21 DIAGNOSIS — R7303 Prediabetes: Secondary | ICD-10-CM | POA: Diagnosis not present

## 2024-01-21 DIAGNOSIS — Z79899 Other long term (current) drug therapy: Secondary | ICD-10-CM | POA: Diagnosis not present

## 2024-01-21 DIAGNOSIS — Z9181 History of falling: Secondary | ICD-10-CM | POA: Diagnosis not present

## 2024-01-21 DIAGNOSIS — I10 Essential (primary) hypertension: Secondary | ICD-10-CM | POA: Diagnosis not present

## 2024-01-21 DIAGNOSIS — M129 Arthropathy, unspecified: Secondary | ICD-10-CM | POA: Diagnosis not present

## 2024-01-21 DIAGNOSIS — M549 Dorsalgia, unspecified: Secondary | ICD-10-CM | POA: Diagnosis not present

## 2024-01-21 DIAGNOSIS — G8929 Other chronic pain: Secondary | ICD-10-CM | POA: Diagnosis not present

## 2024-01-21 DIAGNOSIS — R29818 Other symptoms and signs involving the nervous system: Secondary | ICD-10-CM | POA: Diagnosis not present

## 2024-01-21 DIAGNOSIS — M47816 Spondylosis without myelopathy or radiculopathy, lumbar region: Secondary | ICD-10-CM | POA: Diagnosis not present

## 2024-02-12 DIAGNOSIS — J9601 Acute respiratory failure with hypoxia: Secondary | ICD-10-CM | POA: Diagnosis not present

## 2024-02-12 DIAGNOSIS — J449 Chronic obstructive pulmonary disease, unspecified: Secondary | ICD-10-CM | POA: Diagnosis not present

## 2024-02-13 NOTE — Progress Notes (Signed)
   02/13/2024  Patient ID: [name], male   DOB: 1960/10/30, 63 y.o.   MRN: [id number]  Reviewed patient regarding medication adherence from a quality report for [facility]. The patient failed MAD in 2024, is in MAD and [facility] for 2025.      Per [facility] and payor portal fill history: Jardiance  25 mg - last filled 11/25/23 for a 30-day supply.  Olmesartan - Hydrochlorothiazide 40-25 mg - last filled 12/25/23 for a 90-day supply.    I will follow up for adherence monitoring and screen patient for medication assistance for Jardiance .    Thank you for allowing pharmacy to be a part of this patient's care.    Livia Riffle, PharmD Clinical Pharmacist  [phone]

## 2024-02-19 DIAGNOSIS — Z79899 Other long term (current) drug therapy: Secondary | ICD-10-CM | POA: Diagnosis not present

## 2024-02-19 DIAGNOSIS — R768 Other specified abnormal immunological findings in serum: Secondary | ICD-10-CM | POA: Diagnosis not present

## 2024-02-19 DIAGNOSIS — Z9181 History of falling: Secondary | ICD-10-CM | POA: Diagnosis not present

## 2024-02-19 DIAGNOSIS — M47816 Spondylosis without myelopathy or radiculopathy, lumbar region: Secondary | ICD-10-CM | POA: Diagnosis not present

## 2024-02-23 DIAGNOSIS — I10 Essential (primary) hypertension: Secondary | ICD-10-CM | POA: Diagnosis not present

## 2024-02-23 DIAGNOSIS — E1169 Type 2 diabetes mellitus with other specified complication: Secondary | ICD-10-CM | POA: Diagnosis not present

## 2024-02-23 DIAGNOSIS — N183 Chronic kidney disease, stage 3 unspecified: Secondary | ICD-10-CM | POA: Diagnosis not present

## 2024-02-23 DIAGNOSIS — M940 Chondrocostal junction syndrome [Tietze]: Secondary | ICD-10-CM | POA: Diagnosis not present

## 2024-02-23 DIAGNOSIS — E782 Mixed hyperlipidemia: Secondary | ICD-10-CM | POA: Diagnosis not present

## 2024-02-23 DIAGNOSIS — E1165 Type 2 diabetes mellitus with hyperglycemia: Secondary | ICD-10-CM | POA: Diagnosis not present

## 2024-02-23 DIAGNOSIS — I509 Heart failure, unspecified: Secondary | ICD-10-CM | POA: Diagnosis not present

## 2024-02-23 DIAGNOSIS — Z79899 Other long term (current) drug therapy: Secondary | ICD-10-CM | POA: Diagnosis not present

## 2024-02-23 DIAGNOSIS — I129 Hypertensive chronic kidney disease with stage 1 through stage 4 chronic kidney disease, or unspecified chronic kidney disease: Secondary | ICD-10-CM | POA: Diagnosis not present

## 2024-03-02 DIAGNOSIS — M75102 Unspecified rotator cuff tear or rupture of left shoulder, not specified as traumatic: Secondary | ICD-10-CM | POA: Diagnosis not present

## 2024-03-02 DIAGNOSIS — I129 Hypertensive chronic kidney disease with stage 1 through stage 4 chronic kidney disease, or unspecified chronic kidney disease: Secondary | ICD-10-CM | POA: Diagnosis not present

## 2024-03-02 DIAGNOSIS — E1165 Type 2 diabetes mellitus with hyperglycemia: Secondary | ICD-10-CM | POA: Diagnosis not present

## 2024-03-14 DIAGNOSIS — J9601 Acute respiratory failure with hypoxia: Secondary | ICD-10-CM | POA: Diagnosis not present

## 2024-03-14 DIAGNOSIS — J449 Chronic obstructive pulmonary disease, unspecified: Secondary | ICD-10-CM | POA: Diagnosis not present

## 2024-03-23 DIAGNOSIS — R0602 Shortness of breath: Secondary | ICD-10-CM | POA: Diagnosis not present

## 2024-03-23 DIAGNOSIS — J302 Other seasonal allergic rhinitis: Secondary | ICD-10-CM | POA: Diagnosis not present

## 2024-03-23 DIAGNOSIS — R768 Other specified abnormal immunological findings in serum: Secondary | ICD-10-CM | POA: Diagnosis not present

## 2024-03-23 DIAGNOSIS — M47816 Spondylosis without myelopathy or radiculopathy, lumbar region: Secondary | ICD-10-CM | POA: Diagnosis not present

## 2024-03-23 DIAGNOSIS — E790 Hyperuricemia without signs of inflammatory arthritis and tophaceous disease: Secondary | ICD-10-CM | POA: Diagnosis not present

## 2024-03-23 DIAGNOSIS — F17210 Nicotine dependence, cigarettes, uncomplicated: Secondary | ICD-10-CM | POA: Diagnosis not present

## 2024-03-23 DIAGNOSIS — Z9181 History of falling: Secondary | ICD-10-CM | POA: Diagnosis not present

## 2024-03-23 DIAGNOSIS — R29818 Other symptoms and signs involving the nervous system: Secondary | ICD-10-CM | POA: Diagnosis not present

## 2024-03-23 DIAGNOSIS — Z79899 Other long term (current) drug therapy: Secondary | ICD-10-CM | POA: Diagnosis not present

## 2024-03-23 DIAGNOSIS — I10 Essential (primary) hypertension: Secondary | ICD-10-CM | POA: Diagnosis not present

## 2024-03-23 DIAGNOSIS — R7303 Prediabetes: Secondary | ICD-10-CM | POA: Diagnosis not present

## 2024-03-26 DIAGNOSIS — Z79899 Other long term (current) drug therapy: Secondary | ICD-10-CM | POA: Diagnosis not present

## 2024-03-31 DIAGNOSIS — M48061 Spinal stenosis, lumbar region without neurogenic claudication: Secondary | ICD-10-CM | POA: Diagnosis not present

## 2024-03-31 DIAGNOSIS — M5136 Other intervertebral disc degeneration, lumbar region with discogenic back pain only: Secondary | ICD-10-CM | POA: Diagnosis not present

## 2024-03-31 DIAGNOSIS — M5137 Other intervertebral disc degeneration, lumbosacral region with discogenic back pain only: Secondary | ICD-10-CM | POA: Diagnosis not present

## 2024-03-31 DIAGNOSIS — M109 Gout, unspecified: Secondary | ICD-10-CM | POA: Diagnosis not present

## 2024-03-31 DIAGNOSIS — N281 Cyst of kidney, acquired: Secondary | ICD-10-CM | POA: Diagnosis not present

## 2024-03-31 DIAGNOSIS — E860 Dehydration: Secondary | ICD-10-CM | POA: Diagnosis not present

## 2024-03-31 DIAGNOSIS — R1084 Generalized abdominal pain: Secondary | ICD-10-CM | POA: Diagnosis not present

## 2024-03-31 DIAGNOSIS — Z96651 Presence of right artificial knee joint: Secondary | ICD-10-CM | POA: Diagnosis not present

## 2024-03-31 DIAGNOSIS — G8929 Other chronic pain: Secondary | ICD-10-CM | POA: Diagnosis not present

## 2024-03-31 DIAGNOSIS — D3502 Benign neoplasm of left adrenal gland: Secondary | ICD-10-CM | POA: Diagnosis not present

## 2024-03-31 DIAGNOSIS — C642 Malignant neoplasm of left kidney, except renal pelvis: Secondary | ICD-10-CM | POA: Diagnosis not present

## 2024-03-31 DIAGNOSIS — Z79891 Long term (current) use of opiate analgesic: Secondary | ICD-10-CM | POA: Diagnosis not present

## 2024-03-31 DIAGNOSIS — Z79899 Other long term (current) drug therapy: Secondary | ICD-10-CM | POA: Diagnosis not present

## 2024-03-31 DIAGNOSIS — R42 Dizziness and giddiness: Secondary | ICD-10-CM | POA: Diagnosis not present

## 2024-03-31 DIAGNOSIS — Z8673 Personal history of transient ischemic attack (TIA), and cerebral infarction without residual deficits: Secondary | ICD-10-CM | POA: Diagnosis not present

## 2024-03-31 DIAGNOSIS — T465X5A Adverse effect of other antihypertensive drugs, initial encounter: Secondary | ICD-10-CM | POA: Diagnosis not present

## 2024-03-31 DIAGNOSIS — I5032 Chronic diastolic (congestive) heart failure: Secondary | ICD-10-CM | POA: Diagnosis not present

## 2024-03-31 DIAGNOSIS — M549 Dorsalgia, unspecified: Secondary | ICD-10-CM | POA: Diagnosis not present

## 2024-03-31 DIAGNOSIS — D3501 Benign neoplasm of right adrenal gland: Secondary | ICD-10-CM | POA: Diagnosis not present

## 2024-03-31 DIAGNOSIS — Z7409 Other reduced mobility: Secondary | ICD-10-CM | POA: Diagnosis not present

## 2024-03-31 DIAGNOSIS — I10 Essential (primary) hypertension: Secondary | ICD-10-CM | POA: Diagnosis not present

## 2024-03-31 DIAGNOSIS — K869 Disease of pancreas, unspecified: Secondary | ICD-10-CM | POA: Diagnosis not present

## 2024-03-31 DIAGNOSIS — M545 Low back pain, unspecified: Secondary | ICD-10-CM | POA: Diagnosis not present

## 2024-03-31 DIAGNOSIS — N179 Acute kidney failure, unspecified: Secondary | ICD-10-CM | POA: Diagnosis not present

## 2024-03-31 DIAGNOSIS — K8689 Other specified diseases of pancreas: Secondary | ICD-10-CM | POA: Diagnosis not present

## 2024-03-31 DIAGNOSIS — R404 Transient alteration of awareness: Secondary | ICD-10-CM | POA: Diagnosis not present

## 2024-03-31 DIAGNOSIS — N2889 Other specified disorders of kidney and ureter: Secondary | ICD-10-CM | POA: Diagnosis not present

## 2024-03-31 DIAGNOSIS — D7389 Other diseases of spleen: Secondary | ICD-10-CM | POA: Diagnosis not present

## 2024-03-31 DIAGNOSIS — N170 Acute kidney failure with tubular necrosis: Secondary | ICD-10-CM | POA: Diagnosis not present

## 2024-03-31 DIAGNOSIS — R109 Unspecified abdominal pain: Secondary | ICD-10-CM | POA: Diagnosis not present

## 2024-03-31 DIAGNOSIS — I110 Hypertensive heart disease with heart failure: Secondary | ICD-10-CM | POA: Diagnosis not present

## 2024-03-31 DIAGNOSIS — R34 Anuria and oliguria: Secondary | ICD-10-CM | POA: Diagnosis not present

## 2024-03-31 DIAGNOSIS — I959 Hypotension, unspecified: Secondary | ICD-10-CM | POA: Diagnosis not present

## 2024-03-31 DIAGNOSIS — C252 Malignant neoplasm of tail of pancreas: Secondary | ICD-10-CM | POA: Diagnosis not present

## 2024-03-31 DIAGNOSIS — I2510 Atherosclerotic heart disease of native coronary artery without angina pectoris: Secondary | ICD-10-CM | POA: Diagnosis not present

## 2024-04-09 DIAGNOSIS — I10 Essential (primary) hypertension: Secondary | ICD-10-CM | POA: Diagnosis not present

## 2024-04-09 DIAGNOSIS — C642 Malignant neoplasm of left kidney, except renal pelvis: Secondary | ICD-10-CM | POA: Diagnosis not present

## 2024-04-09 DIAGNOSIS — I119 Hypertensive heart disease without heart failure: Secondary | ICD-10-CM | POA: Diagnosis not present

## 2024-04-09 DIAGNOSIS — C252 Malignant neoplasm of tail of pancreas: Secondary | ICD-10-CM | POA: Diagnosis not present

## 2024-04-09 DIAGNOSIS — N189 Chronic kidney disease, unspecified: Secondary | ICD-10-CM | POA: Diagnosis not present

## 2024-04-09 DIAGNOSIS — I509 Heart failure, unspecified: Secondary | ICD-10-CM | POA: Diagnosis not present

## 2024-04-09 DIAGNOSIS — G8194 Hemiplegia, unspecified affecting left nondominant side: Secondary | ICD-10-CM | POA: Diagnosis not present

## 2024-04-09 DIAGNOSIS — I129 Hypertensive chronic kidney disease with stage 1 through stage 4 chronic kidney disease, or unspecified chronic kidney disease: Secondary | ICD-10-CM | POA: Diagnosis not present

## 2024-04-09 DIAGNOSIS — E1169 Type 2 diabetes mellitus with other specified complication: Secondary | ICD-10-CM | POA: Diagnosis not present

## 2024-04-09 DIAGNOSIS — N1832 Chronic kidney disease, stage 3b: Secondary | ICD-10-CM | POA: Diagnosis not present

## 2024-04-10 DIAGNOSIS — Z556 Problems related to health literacy: Secondary | ICD-10-CM | POA: Diagnosis not present

## 2024-04-10 DIAGNOSIS — I110 Hypertensive heart disease with heart failure: Secondary | ICD-10-CM | POA: Diagnosis not present

## 2024-04-10 DIAGNOSIS — N179 Acute kidney failure, unspecified: Secondary | ICD-10-CM | POA: Diagnosis not present

## 2024-04-10 DIAGNOSIS — I5030 Unspecified diastolic (congestive) heart failure: Secondary | ICD-10-CM | POA: Diagnosis not present

## 2024-04-10 DIAGNOSIS — K8689 Other specified diseases of pancreas: Secondary | ICD-10-CM | POA: Diagnosis not present

## 2024-04-10 DIAGNOSIS — M109 Gout, unspecified: Secondary | ICD-10-CM | POA: Diagnosis not present

## 2024-04-10 DIAGNOSIS — Z604 Social exclusion and rejection: Secondary | ICD-10-CM | POA: Diagnosis not present

## 2024-04-10 DIAGNOSIS — G8929 Other chronic pain: Secondary | ICD-10-CM | POA: Diagnosis not present

## 2024-04-10 DIAGNOSIS — J449 Chronic obstructive pulmonary disease, unspecified: Secondary | ICD-10-CM | POA: Diagnosis not present

## 2024-04-10 DIAGNOSIS — Z96651 Presence of right artificial knee joint: Secondary | ICD-10-CM | POA: Diagnosis not present

## 2024-04-10 DIAGNOSIS — M545 Low back pain, unspecified: Secondary | ICD-10-CM | POA: Diagnosis not present

## 2024-04-10 DIAGNOSIS — Z9981 Dependence on supplemental oxygen: Secondary | ICD-10-CM | POA: Diagnosis not present

## 2024-04-10 DIAGNOSIS — Z87891 Personal history of nicotine dependence: Secondary | ICD-10-CM | POA: Diagnosis not present

## 2024-04-10 DIAGNOSIS — Z8673 Personal history of transient ischemic attack (TIA), and cerebral infarction without residual deficits: Secondary | ICD-10-CM | POA: Diagnosis not present

## 2024-04-10 DIAGNOSIS — I2510 Atherosclerotic heart disease of native coronary artery without angina pectoris: Secondary | ICD-10-CM | POA: Diagnosis not present

## 2024-04-13 DIAGNOSIS — J449 Chronic obstructive pulmonary disease, unspecified: Secondary | ICD-10-CM | POA: Diagnosis not present

## 2024-04-13 DIAGNOSIS — J9601 Acute respiratory failure with hypoxia: Secondary | ICD-10-CM | POA: Diagnosis not present

## 2024-04-20 DIAGNOSIS — M5451 Vertebrogenic low back pain: Secondary | ICD-10-CM | POA: Diagnosis not present

## 2024-04-20 DIAGNOSIS — G8929 Other chronic pain: Secondary | ICD-10-CM | POA: Diagnosis not present

## 2024-04-22 DIAGNOSIS — J302 Other seasonal allergic rhinitis: Secondary | ICD-10-CM | POA: Diagnosis not present

## 2024-04-22 DIAGNOSIS — I10 Essential (primary) hypertension: Secondary | ICD-10-CM | POA: Diagnosis not present

## 2024-04-22 DIAGNOSIS — R768 Other specified abnormal immunological findings in serum: Secondary | ICD-10-CM | POA: Diagnosis not present

## 2024-04-22 DIAGNOSIS — F17210 Nicotine dependence, cigarettes, uncomplicated: Secondary | ICD-10-CM | POA: Diagnosis not present

## 2024-04-22 DIAGNOSIS — Z9181 History of falling: Secondary | ICD-10-CM | POA: Diagnosis not present

## 2024-04-22 DIAGNOSIS — R7303 Prediabetes: Secondary | ICD-10-CM | POA: Diagnosis not present

## 2024-04-22 DIAGNOSIS — Z79899 Other long term (current) drug therapy: Secondary | ICD-10-CM | POA: Diagnosis not present

## 2024-04-22 DIAGNOSIS — M47816 Spondylosis without myelopathy or radiculopathy, lumbar region: Secondary | ICD-10-CM | POA: Diagnosis not present

## 2024-04-22 DIAGNOSIS — E790 Hyperuricemia without signs of inflammatory arthritis and tophaceous disease: Secondary | ICD-10-CM | POA: Diagnosis not present

## 2024-04-22 DIAGNOSIS — R29818 Other symptoms and signs involving the nervous system: Secondary | ICD-10-CM | POA: Diagnosis not present

## 2024-04-23 DIAGNOSIS — I10 Essential (primary) hypertension: Secondary | ICD-10-CM | POA: Diagnosis not present

## 2024-04-23 DIAGNOSIS — G8921 Chronic pain due to trauma: Secondary | ICD-10-CM | POA: Diagnosis not present

## 2024-04-23 DIAGNOSIS — N390 Urinary tract infection, site not specified: Secondary | ICD-10-CM | POA: Diagnosis not present

## 2024-04-23 DIAGNOSIS — G8194 Hemiplegia, unspecified affecting left nondominant side: Secondary | ICD-10-CM | POA: Diagnosis not present

## 2024-04-29 DIAGNOSIS — I5030 Unspecified diastolic (congestive) heart failure: Secondary | ICD-10-CM | POA: Diagnosis not present

## 2024-04-29 DIAGNOSIS — Z79899 Other long term (current) drug therapy: Secondary | ICD-10-CM | POA: Diagnosis not present

## 2024-04-29 DIAGNOSIS — N179 Acute kidney failure, unspecified: Secondary | ICD-10-CM | POA: Diagnosis not present

## 2024-04-29 DIAGNOSIS — Z87891 Personal history of nicotine dependence: Secondary | ICD-10-CM | POA: Diagnosis not present

## 2024-04-29 DIAGNOSIS — Z9981 Dependence on supplemental oxygen: Secondary | ICD-10-CM | POA: Diagnosis not present

## 2024-04-29 DIAGNOSIS — Z8673 Personal history of transient ischemic attack (TIA), and cerebral infarction without residual deficits: Secondary | ICD-10-CM | POA: Diagnosis not present

## 2024-04-29 DIAGNOSIS — K8689 Other specified diseases of pancreas: Secondary | ICD-10-CM | POA: Diagnosis not present

## 2024-04-29 DIAGNOSIS — G8929 Other chronic pain: Secondary | ICD-10-CM | POA: Diagnosis not present

## 2024-04-29 DIAGNOSIS — I2510 Atherosclerotic heart disease of native coronary artery without angina pectoris: Secondary | ICD-10-CM | POA: Diagnosis not present

## 2024-04-29 DIAGNOSIS — Z556 Problems related to health literacy: Secondary | ICD-10-CM | POA: Diagnosis not present

## 2024-04-29 DIAGNOSIS — Z604 Social exclusion and rejection: Secondary | ICD-10-CM | POA: Diagnosis not present

## 2024-04-29 DIAGNOSIS — J449 Chronic obstructive pulmonary disease, unspecified: Secondary | ICD-10-CM | POA: Diagnosis not present

## 2024-04-29 DIAGNOSIS — M109 Gout, unspecified: Secondary | ICD-10-CM | POA: Diagnosis not present

## 2024-04-29 DIAGNOSIS — Z96651 Presence of right artificial knee joint: Secondary | ICD-10-CM | POA: Diagnosis not present

## 2024-04-29 DIAGNOSIS — M545 Low back pain, unspecified: Secondary | ICD-10-CM | POA: Diagnosis not present

## 2024-04-29 DIAGNOSIS — I110 Hypertensive heart disease with heart failure: Secondary | ICD-10-CM | POA: Diagnosis not present

## 2024-05-11 DIAGNOSIS — Z87891 Personal history of nicotine dependence: Secondary | ICD-10-CM | POA: Diagnosis not present

## 2024-05-11 DIAGNOSIS — M109 Gout, unspecified: Secondary | ICD-10-CM | POA: Diagnosis not present

## 2024-05-11 DIAGNOSIS — Z8673 Personal history of transient ischemic attack (TIA), and cerebral infarction without residual deficits: Secondary | ICD-10-CM | POA: Diagnosis not present

## 2024-05-11 DIAGNOSIS — N179 Acute kidney failure, unspecified: Secondary | ICD-10-CM | POA: Diagnosis not present

## 2024-05-11 DIAGNOSIS — Z9981 Dependence on supplemental oxygen: Secondary | ICD-10-CM | POA: Diagnosis not present

## 2024-05-11 DIAGNOSIS — I2510 Atherosclerotic heart disease of native coronary artery without angina pectoris: Secondary | ICD-10-CM | POA: Diagnosis not present

## 2024-05-11 DIAGNOSIS — J449 Chronic obstructive pulmonary disease, unspecified: Secondary | ICD-10-CM | POA: Diagnosis not present

## 2024-05-11 DIAGNOSIS — Z96651 Presence of right artificial knee joint: Secondary | ICD-10-CM | POA: Diagnosis not present

## 2024-05-11 DIAGNOSIS — G8929 Other chronic pain: Secondary | ICD-10-CM | POA: Diagnosis not present

## 2024-05-11 DIAGNOSIS — Z604 Social exclusion and rejection: Secondary | ICD-10-CM | POA: Diagnosis not present

## 2024-05-11 DIAGNOSIS — I110 Hypertensive heart disease with heart failure: Secondary | ICD-10-CM | POA: Diagnosis not present

## 2024-05-11 DIAGNOSIS — M545 Low back pain, unspecified: Secondary | ICD-10-CM | POA: Diagnosis not present

## 2024-05-11 DIAGNOSIS — K8689 Other specified diseases of pancreas: Secondary | ICD-10-CM | POA: Diagnosis not present

## 2024-05-11 DIAGNOSIS — Z556 Problems related to health literacy: Secondary | ICD-10-CM | POA: Diagnosis not present

## 2024-05-11 DIAGNOSIS — I5030 Unspecified diastolic (congestive) heart failure: Secondary | ICD-10-CM | POA: Diagnosis not present

## 2024-05-12 ENCOUNTER — Ambulatory Visit: Admitting: Urology

## 2024-05-12 DIAGNOSIS — Z96651 Presence of right artificial knee joint: Secondary | ICD-10-CM | POA: Diagnosis not present

## 2024-05-12 DIAGNOSIS — J449 Chronic obstructive pulmonary disease, unspecified: Secondary | ICD-10-CM | POA: Diagnosis not present

## 2024-05-12 DIAGNOSIS — Z9981 Dependence on supplemental oxygen: Secondary | ICD-10-CM | POA: Diagnosis not present

## 2024-05-12 DIAGNOSIS — K8689 Other specified diseases of pancreas: Secondary | ICD-10-CM | POA: Diagnosis not present

## 2024-05-12 DIAGNOSIS — I5030 Unspecified diastolic (congestive) heart failure: Secondary | ICD-10-CM | POA: Diagnosis not present

## 2024-05-12 DIAGNOSIS — I2510 Atherosclerotic heart disease of native coronary artery without angina pectoris: Secondary | ICD-10-CM | POA: Diagnosis not present

## 2024-05-12 DIAGNOSIS — Z87891 Personal history of nicotine dependence: Secondary | ICD-10-CM | POA: Diagnosis not present

## 2024-05-12 DIAGNOSIS — I110 Hypertensive heart disease with heart failure: Secondary | ICD-10-CM | POA: Diagnosis not present

## 2024-05-12 DIAGNOSIS — M545 Low back pain, unspecified: Secondary | ICD-10-CM | POA: Diagnosis not present

## 2024-05-12 DIAGNOSIS — N179 Acute kidney failure, unspecified: Secondary | ICD-10-CM | POA: Diagnosis not present

## 2024-05-12 DIAGNOSIS — Z556 Problems related to health literacy: Secondary | ICD-10-CM | POA: Diagnosis not present

## 2024-05-12 DIAGNOSIS — G8929 Other chronic pain: Secondary | ICD-10-CM | POA: Diagnosis not present

## 2024-05-12 DIAGNOSIS — M109 Gout, unspecified: Secondary | ICD-10-CM | POA: Diagnosis not present

## 2024-05-12 DIAGNOSIS — Z8673 Personal history of transient ischemic attack (TIA), and cerebral infarction without residual deficits: Secondary | ICD-10-CM | POA: Diagnosis not present

## 2024-05-12 DIAGNOSIS — Z604 Social exclusion and rejection: Secondary | ICD-10-CM | POA: Diagnosis not present

## 2024-05-14 DIAGNOSIS — J449 Chronic obstructive pulmonary disease, unspecified: Secondary | ICD-10-CM | POA: Diagnosis not present

## 2024-05-14 DIAGNOSIS — J9601 Acute respiratory failure with hypoxia: Secondary | ICD-10-CM | POA: Diagnosis not present

## 2024-05-19 DIAGNOSIS — G8929 Other chronic pain: Secondary | ICD-10-CM | POA: Diagnosis not present

## 2024-05-19 DIAGNOSIS — I2510 Atherosclerotic heart disease of native coronary artery without angina pectoris: Secondary | ICD-10-CM | POA: Diagnosis not present

## 2024-05-19 DIAGNOSIS — I5030 Unspecified diastolic (congestive) heart failure: Secondary | ICD-10-CM | POA: Diagnosis not present

## 2024-05-19 DIAGNOSIS — M545 Low back pain, unspecified: Secondary | ICD-10-CM | POA: Diagnosis not present

## 2024-05-19 DIAGNOSIS — Z8673 Personal history of transient ischemic attack (TIA), and cerebral infarction without residual deficits: Secondary | ICD-10-CM | POA: Diagnosis not present

## 2024-05-19 DIAGNOSIS — I110 Hypertensive heart disease with heart failure: Secondary | ICD-10-CM | POA: Diagnosis not present

## 2024-05-19 DIAGNOSIS — M109 Gout, unspecified: Secondary | ICD-10-CM | POA: Diagnosis not present

## 2024-05-19 DIAGNOSIS — K8689 Other specified diseases of pancreas: Secondary | ICD-10-CM | POA: Diagnosis not present

## 2024-05-19 DIAGNOSIS — Z87891 Personal history of nicotine dependence: Secondary | ICD-10-CM | POA: Diagnosis not present

## 2024-05-19 DIAGNOSIS — Z556 Problems related to health literacy: Secondary | ICD-10-CM | POA: Diagnosis not present

## 2024-05-19 DIAGNOSIS — J449 Chronic obstructive pulmonary disease, unspecified: Secondary | ICD-10-CM | POA: Diagnosis not present

## 2024-05-19 DIAGNOSIS — Z604 Social exclusion and rejection: Secondary | ICD-10-CM | POA: Diagnosis not present

## 2024-05-19 DIAGNOSIS — Z9981 Dependence on supplemental oxygen: Secondary | ICD-10-CM | POA: Diagnosis not present

## 2024-05-19 DIAGNOSIS — Z96651 Presence of right artificial knee joint: Secondary | ICD-10-CM | POA: Diagnosis not present

## 2024-05-19 DIAGNOSIS — N179 Acute kidney failure, unspecified: Secondary | ICD-10-CM | POA: Diagnosis not present

## 2024-05-25 DIAGNOSIS — E790 Hyperuricemia without signs of inflammatory arthritis and tophaceous disease: Secondary | ICD-10-CM | POA: Diagnosis not present

## 2024-05-25 DIAGNOSIS — R0602 Shortness of breath: Secondary | ICD-10-CM | POA: Diagnosis not present

## 2024-05-25 DIAGNOSIS — Z79899 Other long term (current) drug therapy: Secondary | ICD-10-CM | POA: Diagnosis not present

## 2024-05-25 DIAGNOSIS — Z9181 History of falling: Secondary | ICD-10-CM | POA: Diagnosis not present

## 2024-05-25 DIAGNOSIS — I10 Essential (primary) hypertension: Secondary | ICD-10-CM | POA: Diagnosis not present

## 2024-05-25 DIAGNOSIS — R7303 Prediabetes: Secondary | ICD-10-CM | POA: Diagnosis not present

## 2024-05-25 DIAGNOSIS — F17210 Nicotine dependence, cigarettes, uncomplicated: Secondary | ICD-10-CM | POA: Diagnosis not present

## 2024-05-25 DIAGNOSIS — M47816 Spondylosis without myelopathy or radiculopathy, lumbar region: Secondary | ICD-10-CM | POA: Diagnosis not present

## 2024-05-25 DIAGNOSIS — R768 Other specified abnormal immunological findings in serum: Secondary | ICD-10-CM | POA: Diagnosis not present

## 2024-05-26 DIAGNOSIS — M47816 Spondylosis without myelopathy or radiculopathy, lumbar region: Secondary | ICD-10-CM | POA: Diagnosis not present

## 2024-05-26 DIAGNOSIS — G4710 Hypersomnia, unspecified: Secondary | ICD-10-CM | POA: Diagnosis not present

## 2024-05-26 DIAGNOSIS — J302 Other seasonal allergic rhinitis: Secondary | ICD-10-CM | POA: Diagnosis not present

## 2024-05-26 DIAGNOSIS — R030 Elevated blood-pressure reading, without diagnosis of hypertension: Secondary | ICD-10-CM | POA: Diagnosis not present

## 2024-05-26 DIAGNOSIS — I10 Essential (primary) hypertension: Secondary | ICD-10-CM | POA: Diagnosis not present

## 2024-05-27 DIAGNOSIS — Z8673 Personal history of transient ischemic attack (TIA), and cerebral infarction without residual deficits: Secondary | ICD-10-CM | POA: Diagnosis not present

## 2024-05-27 DIAGNOSIS — K8689 Other specified diseases of pancreas: Secondary | ICD-10-CM | POA: Diagnosis not present

## 2024-05-27 DIAGNOSIS — I5030 Unspecified diastolic (congestive) heart failure: Secondary | ICD-10-CM | POA: Diagnosis not present

## 2024-05-27 DIAGNOSIS — N179 Acute kidney failure, unspecified: Secondary | ICD-10-CM | POA: Diagnosis not present

## 2024-05-27 DIAGNOSIS — I110 Hypertensive heart disease with heart failure: Secondary | ICD-10-CM | POA: Diagnosis not present

## 2024-05-27 DIAGNOSIS — M545 Low back pain, unspecified: Secondary | ICD-10-CM | POA: Diagnosis not present

## 2024-05-27 DIAGNOSIS — Z9981 Dependence on supplemental oxygen: Secondary | ICD-10-CM | POA: Diagnosis not present

## 2024-05-27 DIAGNOSIS — I2510 Atherosclerotic heart disease of native coronary artery without angina pectoris: Secondary | ICD-10-CM | POA: Diagnosis not present

## 2024-05-27 DIAGNOSIS — G8929 Other chronic pain: Secondary | ICD-10-CM | POA: Diagnosis not present

## 2024-05-27 DIAGNOSIS — Z604 Social exclusion and rejection: Secondary | ICD-10-CM | POA: Diagnosis not present

## 2024-05-27 DIAGNOSIS — J449 Chronic obstructive pulmonary disease, unspecified: Secondary | ICD-10-CM | POA: Diagnosis not present

## 2024-05-27 DIAGNOSIS — M109 Gout, unspecified: Secondary | ICD-10-CM | POA: Diagnosis not present

## 2024-05-27 DIAGNOSIS — Z556 Problems related to health literacy: Secondary | ICD-10-CM | POA: Diagnosis not present

## 2024-05-27 DIAGNOSIS — Z87891 Personal history of nicotine dependence: Secondary | ICD-10-CM | POA: Diagnosis not present

## 2024-05-27 DIAGNOSIS — Z96651 Presence of right artificial knee joint: Secondary | ICD-10-CM | POA: Diagnosis not present

## 2024-05-29 DIAGNOSIS — Z9981 Dependence on supplemental oxygen: Secondary | ICD-10-CM | POA: Diagnosis not present

## 2024-05-29 DIAGNOSIS — M109 Gout, unspecified: Secondary | ICD-10-CM | POA: Diagnosis not present

## 2024-05-29 DIAGNOSIS — I5030 Unspecified diastolic (congestive) heart failure: Secondary | ICD-10-CM | POA: Diagnosis not present

## 2024-05-29 DIAGNOSIS — M545 Low back pain, unspecified: Secondary | ICD-10-CM | POA: Diagnosis not present

## 2024-05-29 DIAGNOSIS — N179 Acute kidney failure, unspecified: Secondary | ICD-10-CM | POA: Diagnosis not present

## 2024-05-29 DIAGNOSIS — Z8673 Personal history of transient ischemic attack (TIA), and cerebral infarction without residual deficits: Secondary | ICD-10-CM | POA: Diagnosis not present

## 2024-05-29 DIAGNOSIS — I110 Hypertensive heart disease with heart failure: Secondary | ICD-10-CM | POA: Diagnosis not present

## 2024-05-29 DIAGNOSIS — Z87891 Personal history of nicotine dependence: Secondary | ICD-10-CM | POA: Diagnosis not present

## 2024-05-29 DIAGNOSIS — Z96651 Presence of right artificial knee joint: Secondary | ICD-10-CM | POA: Diagnosis not present

## 2024-05-29 DIAGNOSIS — G8929 Other chronic pain: Secondary | ICD-10-CM | POA: Diagnosis not present

## 2024-05-29 DIAGNOSIS — I2510 Atherosclerotic heart disease of native coronary artery without angina pectoris: Secondary | ICD-10-CM | POA: Diagnosis not present

## 2024-05-29 DIAGNOSIS — Z604 Social exclusion and rejection: Secondary | ICD-10-CM | POA: Diagnosis not present

## 2024-05-29 DIAGNOSIS — K8689 Other specified diseases of pancreas: Secondary | ICD-10-CM | POA: Diagnosis not present

## 2024-05-29 DIAGNOSIS — Z556 Problems related to health literacy: Secondary | ICD-10-CM | POA: Diagnosis not present

## 2024-05-29 DIAGNOSIS — J449 Chronic obstructive pulmonary disease, unspecified: Secondary | ICD-10-CM | POA: Diagnosis not present

## 2024-06-01 DIAGNOSIS — Z79899 Other long term (current) drug therapy: Secondary | ICD-10-CM | POA: Diagnosis not present

## 2024-06-03 DIAGNOSIS — N183 Chronic kidney disease, stage 3 unspecified: Secondary | ICD-10-CM | POA: Diagnosis not present

## 2024-06-03 DIAGNOSIS — E1165 Type 2 diabetes mellitus with hyperglycemia: Secondary | ICD-10-CM | POA: Diagnosis not present

## 2024-06-03 DIAGNOSIS — R7302 Impaired glucose tolerance (oral): Secondary | ICD-10-CM | POA: Diagnosis not present

## 2024-06-03 DIAGNOSIS — I129 Hypertensive chronic kidney disease with stage 1 through stage 4 chronic kidney disease, or unspecified chronic kidney disease: Secondary | ICD-10-CM | POA: Diagnosis not present

## 2024-06-08 DIAGNOSIS — I110 Hypertensive heart disease with heart failure: Secondary | ICD-10-CM | POA: Diagnosis not present

## 2024-06-08 DIAGNOSIS — J449 Chronic obstructive pulmonary disease, unspecified: Secondary | ICD-10-CM | POA: Diagnosis not present

## 2024-06-08 DIAGNOSIS — I2510 Atherosclerotic heart disease of native coronary artery without angina pectoris: Secondary | ICD-10-CM | POA: Diagnosis not present

## 2024-06-08 DIAGNOSIS — K8689 Other specified diseases of pancreas: Secondary | ICD-10-CM | POA: Diagnosis not present

## 2024-06-08 DIAGNOSIS — G8929 Other chronic pain: Secondary | ICD-10-CM | POA: Diagnosis not present

## 2024-06-08 DIAGNOSIS — M545 Low back pain, unspecified: Secondary | ICD-10-CM | POA: Diagnosis not present

## 2024-06-08 DIAGNOSIS — Z96651 Presence of right artificial knee joint: Secondary | ICD-10-CM | POA: Diagnosis not present

## 2024-06-08 DIAGNOSIS — I5030 Unspecified diastolic (congestive) heart failure: Secondary | ICD-10-CM | POA: Diagnosis not present

## 2024-06-08 DIAGNOSIS — Z8673 Personal history of transient ischemic attack (TIA), and cerebral infarction without residual deficits: Secondary | ICD-10-CM | POA: Diagnosis not present

## 2024-06-08 DIAGNOSIS — Z87891 Personal history of nicotine dependence: Secondary | ICD-10-CM | POA: Diagnosis not present

## 2024-06-08 DIAGNOSIS — Z9981 Dependence on supplemental oxygen: Secondary | ICD-10-CM | POA: Diagnosis not present

## 2024-06-08 DIAGNOSIS — Z556 Problems related to health literacy: Secondary | ICD-10-CM | POA: Diagnosis not present

## 2024-06-08 DIAGNOSIS — N179 Acute kidney failure, unspecified: Secondary | ICD-10-CM | POA: Diagnosis not present

## 2024-06-08 DIAGNOSIS — M109 Gout, unspecified: Secondary | ICD-10-CM | POA: Diagnosis not present

## 2024-06-08 DIAGNOSIS — Z604 Social exclusion and rejection: Secondary | ICD-10-CM | POA: Diagnosis not present

## 2024-06-14 DIAGNOSIS — J449 Chronic obstructive pulmonary disease, unspecified: Secondary | ICD-10-CM | POA: Diagnosis not present

## 2024-06-14 DIAGNOSIS — J9601 Acute respiratory failure with hypoxia: Secondary | ICD-10-CM | POA: Diagnosis not present

## 2024-06-16 DIAGNOSIS — Z9981 Dependence on supplemental oxygen: Secondary | ICD-10-CM | POA: Diagnosis not present

## 2024-06-16 DIAGNOSIS — Z8673 Personal history of transient ischemic attack (TIA), and cerebral infarction without residual deficits: Secondary | ICD-10-CM | POA: Diagnosis not present

## 2024-06-16 DIAGNOSIS — N179 Acute kidney failure, unspecified: Secondary | ICD-10-CM | POA: Diagnosis not present

## 2024-06-16 DIAGNOSIS — M545 Low back pain, unspecified: Secondary | ICD-10-CM | POA: Diagnosis not present

## 2024-06-16 DIAGNOSIS — K8689 Other specified diseases of pancreas: Secondary | ICD-10-CM | POA: Diagnosis not present

## 2024-06-16 DIAGNOSIS — Z556 Problems related to health literacy: Secondary | ICD-10-CM | POA: Diagnosis not present

## 2024-06-16 DIAGNOSIS — G8929 Other chronic pain: Secondary | ICD-10-CM | POA: Diagnosis not present

## 2024-06-16 DIAGNOSIS — I5030 Unspecified diastolic (congestive) heart failure: Secondary | ICD-10-CM | POA: Diagnosis not present

## 2024-06-16 DIAGNOSIS — Z604 Social exclusion and rejection: Secondary | ICD-10-CM | POA: Diagnosis not present

## 2024-06-16 DIAGNOSIS — I110 Hypertensive heart disease with heart failure: Secondary | ICD-10-CM | POA: Diagnosis not present

## 2024-06-16 DIAGNOSIS — I2510 Atherosclerotic heart disease of native coronary artery without angina pectoris: Secondary | ICD-10-CM | POA: Diagnosis not present

## 2024-06-16 DIAGNOSIS — Z96651 Presence of right artificial knee joint: Secondary | ICD-10-CM | POA: Diagnosis not present

## 2024-06-16 DIAGNOSIS — Z87891 Personal history of nicotine dependence: Secondary | ICD-10-CM | POA: Diagnosis not present

## 2024-06-16 DIAGNOSIS — J449 Chronic obstructive pulmonary disease, unspecified: Secondary | ICD-10-CM | POA: Diagnosis not present

## 2024-06-16 DIAGNOSIS — M109 Gout, unspecified: Secondary | ICD-10-CM | POA: Diagnosis not present

## 2024-06-22 DIAGNOSIS — K8689 Other specified diseases of pancreas: Secondary | ICD-10-CM | POA: Diagnosis not present

## 2024-06-22 DIAGNOSIS — I2510 Atherosclerotic heart disease of native coronary artery without angina pectoris: Secondary | ICD-10-CM | POA: Diagnosis not present

## 2024-06-22 DIAGNOSIS — Z87891 Personal history of nicotine dependence: Secondary | ICD-10-CM | POA: Diagnosis not present

## 2024-06-22 DIAGNOSIS — M109 Gout, unspecified: Secondary | ICD-10-CM | POA: Diagnosis not present

## 2024-06-22 DIAGNOSIS — J449 Chronic obstructive pulmonary disease, unspecified: Secondary | ICD-10-CM | POA: Diagnosis not present

## 2024-06-22 DIAGNOSIS — N179 Acute kidney failure, unspecified: Secondary | ICD-10-CM | POA: Diagnosis not present

## 2024-06-22 DIAGNOSIS — Z604 Social exclusion and rejection: Secondary | ICD-10-CM | POA: Diagnosis not present

## 2024-06-22 DIAGNOSIS — M545 Low back pain, unspecified: Secondary | ICD-10-CM | POA: Diagnosis not present

## 2024-06-22 DIAGNOSIS — Z556 Problems related to health literacy: Secondary | ICD-10-CM | POA: Diagnosis not present

## 2024-06-22 DIAGNOSIS — G8929 Other chronic pain: Secondary | ICD-10-CM | POA: Diagnosis not present

## 2024-06-22 DIAGNOSIS — Z8673 Personal history of transient ischemic attack (TIA), and cerebral infarction without residual deficits: Secondary | ICD-10-CM | POA: Diagnosis not present

## 2024-06-22 DIAGNOSIS — Z9981 Dependence on supplemental oxygen: Secondary | ICD-10-CM | POA: Diagnosis not present

## 2024-06-22 DIAGNOSIS — Z96651 Presence of right artificial knee joint: Secondary | ICD-10-CM | POA: Diagnosis not present

## 2024-06-22 DIAGNOSIS — I110 Hypertensive heart disease with heart failure: Secondary | ICD-10-CM | POA: Diagnosis not present

## 2024-06-22 DIAGNOSIS — I5030 Unspecified diastolic (congestive) heart failure: Secondary | ICD-10-CM | POA: Diagnosis not present

## 2024-06-25 DIAGNOSIS — Z79899 Other long term (current) drug therapy: Secondary | ICD-10-CM | POA: Diagnosis not present

## 2024-06-25 DIAGNOSIS — M47816 Spondylosis without myelopathy or radiculopathy, lumbar region: Secondary | ICD-10-CM | POA: Diagnosis not present

## 2024-06-25 DIAGNOSIS — Z9181 History of falling: Secondary | ICD-10-CM | POA: Diagnosis not present

## 2024-06-25 DIAGNOSIS — I10 Essential (primary) hypertension: Secondary | ICD-10-CM | POA: Diagnosis not present

## 2024-07-06 DIAGNOSIS — I5030 Unspecified diastolic (congestive) heart failure: Secondary | ICD-10-CM | POA: Diagnosis not present

## 2024-07-06 DIAGNOSIS — M545 Low back pain, unspecified: Secondary | ICD-10-CM | POA: Diagnosis not present

## 2024-07-06 DIAGNOSIS — Z96651 Presence of right artificial knee joint: Secondary | ICD-10-CM | POA: Diagnosis not present

## 2024-07-06 DIAGNOSIS — M109 Gout, unspecified: Secondary | ICD-10-CM | POA: Diagnosis not present

## 2024-07-06 DIAGNOSIS — G8929 Other chronic pain: Secondary | ICD-10-CM | POA: Diagnosis not present

## 2024-07-06 DIAGNOSIS — I110 Hypertensive heart disease with heart failure: Secondary | ICD-10-CM | POA: Diagnosis not present

## 2024-07-06 DIAGNOSIS — J449 Chronic obstructive pulmonary disease, unspecified: Secondary | ICD-10-CM | POA: Diagnosis not present

## 2024-07-06 DIAGNOSIS — Z604 Social exclusion and rejection: Secondary | ICD-10-CM | POA: Diagnosis not present

## 2024-07-06 DIAGNOSIS — Z87891 Personal history of nicotine dependence: Secondary | ICD-10-CM | POA: Diagnosis not present

## 2024-07-06 DIAGNOSIS — Z8673 Personal history of transient ischemic attack (TIA), and cerebral infarction without residual deficits: Secondary | ICD-10-CM | POA: Diagnosis not present

## 2024-07-06 DIAGNOSIS — I2510 Atherosclerotic heart disease of native coronary artery without angina pectoris: Secondary | ICD-10-CM | POA: Diagnosis not present

## 2024-07-06 DIAGNOSIS — N179 Acute kidney failure, unspecified: Secondary | ICD-10-CM | POA: Diagnosis not present

## 2024-07-06 DIAGNOSIS — Z556 Problems related to health literacy: Secondary | ICD-10-CM | POA: Diagnosis not present

## 2024-07-06 DIAGNOSIS — K8689 Other specified diseases of pancreas: Secondary | ICD-10-CM | POA: Diagnosis not present

## 2024-07-06 DIAGNOSIS — Z9981 Dependence on supplemental oxygen: Secondary | ICD-10-CM | POA: Diagnosis not present

## 2024-07-14 DIAGNOSIS — J449 Chronic obstructive pulmonary disease, unspecified: Secondary | ICD-10-CM | POA: Diagnosis not present

## 2024-07-14 DIAGNOSIS — J9601 Acute respiratory failure with hypoxia: Secondary | ICD-10-CM | POA: Diagnosis not present

## 2024-07-15 DIAGNOSIS — I10 Essential (primary) hypertension: Secondary | ICD-10-CM | POA: Diagnosis not present

## 2024-07-15 DIAGNOSIS — E1165 Type 2 diabetes mellitus with hyperglycemia: Secondary | ICD-10-CM | POA: Diagnosis not present

## 2024-07-26 DIAGNOSIS — I10 Essential (primary) hypertension: Secondary | ICD-10-CM | POA: Diagnosis not present

## 2024-07-26 DIAGNOSIS — Z9181 History of falling: Secondary | ICD-10-CM | POA: Diagnosis not present

## 2024-07-26 DIAGNOSIS — M47816 Spondylosis without myelopathy or radiculopathy, lumbar region: Secondary | ICD-10-CM | POA: Diagnosis not present

## 2024-07-27 DIAGNOSIS — J449 Chronic obstructive pulmonary disease, unspecified: Secondary | ICD-10-CM | POA: Diagnosis not present

## 2024-07-27 DIAGNOSIS — R0602 Shortness of breath: Secondary | ICD-10-CM | POA: Diagnosis not present

## 2024-08-09 DIAGNOSIS — Z79899 Other long term (current) drug therapy: Secondary | ICD-10-CM | POA: Diagnosis not present

## 2024-08-09 DIAGNOSIS — F17210 Nicotine dependence, cigarettes, uncomplicated: Secondary | ICD-10-CM | POA: Diagnosis not present

## 2024-08-09 DIAGNOSIS — E790 Hyperuricemia without signs of inflammatory arthritis and tophaceous disease: Secondary | ICD-10-CM | POA: Diagnosis not present

## 2024-08-09 DIAGNOSIS — R7303 Prediabetes: Secondary | ICD-10-CM | POA: Diagnosis not present

## 2024-08-09 DIAGNOSIS — I10 Essential (primary) hypertension: Secondary | ICD-10-CM | POA: Diagnosis not present

## 2024-08-09 DIAGNOSIS — M47816 Spondylosis without myelopathy or radiculopathy, lumbar region: Secondary | ICD-10-CM | POA: Diagnosis not present

## 2024-08-09 DIAGNOSIS — R7689 Other specified abnormal immunological findings in serum: Secondary | ICD-10-CM | POA: Diagnosis not present

## 2024-08-09 DIAGNOSIS — Z9181 History of falling: Secondary | ICD-10-CM | POA: Diagnosis not present

## 2024-08-10 ENCOUNTER — Encounter: Payer: Self-pay | Admitting: *Deleted

## 2024-08-10 NOTE — Progress Notes (Signed)
 [name]                                          MRN: [id number]   08/10/2024   The [facility] Quality Team Specialist reviewed this patient medical record for the purposes of chart review for care gap closure. The following were reviewed: chart review for care gap closure-glycemic status assessment and kidney health evaluation for diabetes:eGFR  and uACR.    VBCI Quality Team

## 2024-08-13 DIAGNOSIS — Z79899 Other long term (current) drug therapy: Secondary | ICD-10-CM | POA: Diagnosis not present

## 2024-08-14 DIAGNOSIS — J9601 Acute respiratory failure with hypoxia: Secondary | ICD-10-CM | POA: Diagnosis not present

## 2024-08-14 DIAGNOSIS — J449 Chronic obstructive pulmonary disease, unspecified: Secondary | ICD-10-CM | POA: Diagnosis not present
